# Patient Record
Sex: Male | Born: 1965 | Race: Black or African American | Hispanic: No | Smoking: Never smoker
Health system: Southern US, Community
[De-identification: ages and names within clinical notes are randomized; demographics above are authoritative.]

## PROBLEM LIST (undated history)

## (undated) DIAGNOSIS — M1990 Unspecified osteoarthritis, unspecified site: Secondary | ICD-10-CM

## (undated) DIAGNOSIS — I6783 Posterior reversible encephalopathy syndrome: Secondary | ICD-10-CM

## (undated) DIAGNOSIS — I639 Cerebral infarction, unspecified: Secondary | ICD-10-CM

## (undated) DIAGNOSIS — I10 Essential (primary) hypertension: Secondary | ICD-10-CM

## (undated) HISTORY — PX: COLONOSCOPY: SHX174

---

## 2005-06-08 ENCOUNTER — Emergency Department (HOSPITAL_COMMUNITY): Admission: EM | Admit: 2005-06-08 | Discharge: 2005-06-08 | Payer: Self-pay | Admitting: Emergency Medicine

## 2012-11-17 ENCOUNTER — Encounter (HOSPITAL_COMMUNITY): Payer: Self-pay | Admitting: Emergency Medicine

## 2012-11-17 ENCOUNTER — Inpatient Hospital Stay (HOSPITAL_COMMUNITY)
Admission: EM | Admit: 2012-11-17 | Discharge: 2012-11-24 | DRG: 064 | Disposition: A | Discharge status: Home / Self Care | Payer: MEDICAID | Attending: Internal Medicine | Admitting: Internal Medicine

## 2012-11-17 ENCOUNTER — Encounter (HOSPITAL_COMMUNITY): Payer: Self-pay | Admitting: *Deleted

## 2012-11-17 ENCOUNTER — Emergency Department (HOSPITAL_COMMUNITY): Payer: Self-pay

## 2012-11-17 ENCOUNTER — Emergency Department (INDEPENDENT_AMBULATORY_CARE_PROVIDER_SITE_OTHER)
Admission: EM | Admit: 2012-11-17 | Discharge: 2012-11-17 | Disposition: A | Discharge status: Nursing Home (Includes ICF) | Payer: Self-pay | Source: Home / Self Care | Attending: Emergency Medicine | Admitting: Emergency Medicine

## 2012-11-17 DIAGNOSIS — I809 Phlebitis and thrombophlebitis of unspecified site: Secondary | ICD-10-CM | POA: Diagnosis present

## 2012-11-17 DIAGNOSIS — I639 Cerebral infarction, unspecified: Secondary | ICD-10-CM

## 2012-11-17 DIAGNOSIS — G9349 Other encephalopathy: Secondary | ICD-10-CM | POA: Diagnosis not present

## 2012-11-17 DIAGNOSIS — Z7982 Long term (current) use of aspirin: Secondary | ICD-10-CM

## 2012-11-17 DIAGNOSIS — Z8249 Family history of ischemic heart disease and other diseases of the circulatory system: Secondary | ICD-10-CM

## 2012-11-17 DIAGNOSIS — R29810 Facial weakness: Secondary | ICD-10-CM | POA: Diagnosis present

## 2012-11-17 DIAGNOSIS — R519 Headache, unspecified: Secondary | ICD-10-CM

## 2012-11-17 DIAGNOSIS — R51 Headache: Secondary | ICD-10-CM

## 2012-11-17 DIAGNOSIS — I6350 Cerebral infarction due to unspecified occlusion or stenosis of unspecified cerebral artery: Principal | ICD-10-CM | POA: Diagnosis present

## 2012-11-17 DIAGNOSIS — R9431 Abnormal electrocardiogram [ECG] [EKG]: Secondary | ICD-10-CM | POA: Diagnosis present

## 2012-11-17 DIAGNOSIS — I10 Essential (primary) hypertension: Secondary | ICD-10-CM

## 2012-11-17 DIAGNOSIS — I519 Heart disease, unspecified: Secondary | ICD-10-CM | POA: Diagnosis present

## 2012-11-17 DIAGNOSIS — Z823 Family history of stroke: Secondary | ICD-10-CM

## 2012-11-17 DIAGNOSIS — R471 Dysarthria and anarthria: Secondary | ICD-10-CM | POA: Diagnosis present

## 2012-11-17 DIAGNOSIS — I169 Hypertensive crisis, unspecified: Secondary | ICD-10-CM

## 2012-11-17 DIAGNOSIS — I119 Hypertensive heart disease without heart failure: Secondary | ICD-10-CM

## 2012-11-17 DIAGNOSIS — E876 Hypokalemia: Secondary | ICD-10-CM | POA: Diagnosis present

## 2012-11-17 DIAGNOSIS — I160 Hypertensive urgency: Secondary | ICD-10-CM | POA: Diagnosis present

## 2012-11-17 HISTORY — DX: Essential (primary) hypertension: I10

## 2012-11-17 HISTORY — DX: Cerebral infarction, unspecified: I63.9

## 2012-11-17 LAB — URINALYSIS, ROUTINE W REFLEX MICROSCOPIC
Bilirubin Urine: NEGATIVE
Glucose, UA: NEGATIVE mg/dL
Specific Gravity, Urine: 1.009 (ref 1.005–1.030)
pH: 6.5 (ref 5.0–8.0)

## 2012-11-17 LAB — CBC WITH DIFFERENTIAL/PLATELET
Basophils Absolute: 0 10*3/uL (ref 0.0–0.1)
Basophils Relative: 0 % (ref 0–1)
Eosinophils Relative: 0 % (ref 0–5)
HCT: 40.7 % (ref 39.0–52.0)
Hemoglobin: 14.8 g/dL (ref 13.0–17.0)
Monocytes Absolute: 0.8 10*3/uL (ref 0.1–1.0)
Neutro Abs: 7 10*3/uL (ref 1.7–7.7)
Platelets: 183 10*3/uL (ref 150–400)
WBC: 9.7 10*3/uL (ref 4.0–10.5)

## 2012-11-17 LAB — URINE MICROSCOPIC-ADD ON

## 2012-11-17 LAB — BASIC METABOLIC PANEL: Glucose, Bld: 122 mg/dL — ABNORMAL HIGH (ref 70–99)

## 2012-11-17 LAB — POCT I-STAT TROPONIN I

## 2012-11-17 MED ORDER — CLONIDINE HCL 0.1 MG PO TABS
ORAL_TABLET | ORAL | Status: AC
  Filled 2012-11-17: qty 2

## 2012-11-17 MED ORDER — NICARDIPINE HCL IN NACL 20-0.86 MG/200ML-% IV SOLN
5.0000 mg/h | Freq: Once | INTRAVENOUS | Status: AC
  Administered 2012-11-17: 5 mg/h via INTRAVENOUS
  Filled 2012-11-17: qty 200

## 2012-11-17 MED ORDER — SODIUM CHLORIDE 0.9 % IV SOLN: INTRAVENOUS | Status: DC

## 2012-11-17 MED ORDER — HYDRALAZINE HCL 20 MG/ML IJ SOLN
5.0000 mg | Freq: Once | INTRAMUSCULAR | Status: AC
  Administered 2012-11-17: 5 mg via INTRAVENOUS
  Filled 2012-11-17: qty 1

## 2012-11-17 MED ORDER — HYDRALAZINE HCL 20 MG/ML IJ SOLN
10.0000 mg | INTRAMUSCULAR | Status: DC | PRN
  Administered 2012-11-18 – 2012-11-20 (×4): 10 mg via INTRAVENOUS
  Filled 2012-11-17 (×4): qty 1

## 2012-11-17 MED ORDER — DIPHENHYDRAMINE HCL 50 MG/ML IJ SOLN
25.0000 mg | Freq: Once | INTRAMUSCULAR | Status: AC
  Administered 2012-11-17: 25 mg via INTRAVENOUS
  Filled 2012-11-17: qty 1

## 2012-11-17 MED ORDER — HYDRALAZINE HCL 20 MG/ML IJ SOLN
20.0000 mg | Freq: Once | INTRAMUSCULAR | Status: AC
  Administered 2012-11-17: 20 mg via INTRAVENOUS
  Filled 2012-11-17: qty 1

## 2012-11-17 MED ORDER — ACETAMINOPHEN 325 MG PO TABS
650.0000 mg | ORAL_TABLET | Freq: Four times a day (QID) | ORAL | Status: DC | PRN
  Administered 2012-11-19 (×2): 650 mg via ORAL
  Filled 2012-11-17 (×2): qty 2

## 2012-11-17 MED ORDER — ACETAMINOPHEN 650 MG RE SUPP: 650.0000 mg | Freq: Four times a day (QID) | RECTAL | Status: DC | PRN

## 2012-11-17 MED ORDER — CLONIDINE HCL 0.1 MG PO TABS
0.2000 mg | ORAL_TABLET | Freq: Once | ORAL | Status: AC
  Administered 2012-11-17: 0.2 mg via ORAL

## 2012-11-17 MED ORDER — SODIUM CHLORIDE 0.9 % IJ SOLN
3.0000 mL | Freq: Two times a day (BID) | INTRAMUSCULAR | Status: DC
  Administered 2012-11-18 – 2012-11-24 (×8): 3 mL via INTRAVENOUS

## 2012-11-17 MED ORDER — ACETAMINOPHEN 325 MG PO TABS
ORAL_TABLET | ORAL | Status: AC
  Filled 2012-11-17: qty 2

## 2012-11-17 MED ORDER — METOCLOPRAMIDE HCL 5 MG/ML IJ SOLN
10.0000 mg | Freq: Once | INTRAMUSCULAR | Status: AC
  Administered 2012-11-17: 10 mg via INTRAVENOUS
  Filled 2012-11-17: qty 2

## 2012-11-17 MED ORDER — ASPIRIN EC 81 MG PO TBEC
81.0000 mg | DELAYED_RELEASE_TABLET | Freq: Every day | ORAL | Status: DC
  Administered 2012-11-18 – 2012-11-24 (×7): 81 mg via ORAL
  Filled 2012-11-17 (×7): qty 1

## 2012-11-17 MED ORDER — ACETAMINOPHEN 325 MG PO TABS
650.0000 mg | ORAL_TABLET | Freq: Once | ORAL | Status: AC
  Administered 2012-11-17: 650 mg via ORAL

## 2012-11-17 NOTE — ED Notes (Signed)
Headache onset Wednesday, pain behind right eye and in temporal area, bilaterally.  Denies chest pain, no sob.

## 2012-11-17 NOTE — ED Notes (Signed)
The pt was sent from [facility] with a headache since Wednesday with nv.  He  Was given tylenol and bp pills at [facility].  No history of headaches

## 2012-11-17 NOTE — H&P (Addendum)
Chief Complaint:  headache  HPI: 47 yo male healthy comes in with headache and checked bp over the last several days noted to be high.  He denies any fevers, n/v/d.  No slurred speech or focal neurological deficits.  No cp, no sob.  No le edema or swelling.  Exercises regularly and overall healthy, but htn and strokes run in his family.  He initially had sbp 230 on arrival with high diastolic also, given hydralazine in ED with improvement in his bp and also improvement in his headache.  No rashes.  Pt feels better now, and his sbp is now around 180 dbp 98.  Offered LP in ED by EDP but pt deferred that.  He has no prior cardiac or cva history.  Review of Systems:  Positive and negative as per HPI otherwise all other systems are negative  Past Medical History: Past Medical History  Diagnosis Date  . Hypertension    History reviewed. No pertinent past surgical history.  Medications: Prior to Admission medications   Medication Sig Start Date End Date Taking? Authorizing Provider  ibuprofen (ADVIL,MOTRIN) 200 MG tablet Take 200 mg by mouth once.    Yes Historical Provider, MD  naproxen sodium (ANAPROX) 220 MG tablet Take 440 mg by mouth once.   Yes Historical Provider, MD    Allergies:  No Known Allergies  Social History:  reports that he has never smoked. He does not have any smokeless tobacco history on file. He reports that he does not drink alcohol or use illicit drugs.  Family History: Htn, cva  Physical Exam: Filed Vitals:   11/17/12 2100 11/17/12 2110 11/17/12 2120 11/17/12 2130  BP: 192/93 175/102 179/103 181/97  Pulse: 66 71 77 85  Temp:      Resp: 25 23 33 26  SpO2: 98% 97% 97% 98%   General appearance: alert, cooperative and no distress Head: Normocephalic, without obvious abnormality, atraumatic Eyes: negative Nose: Nares normal. Septum midline. Mucosa normal. No drainage or sinus tenderness. Neck: no JVD and supple, symmetrical, trachea midline Lungs: clear to  auscultation bilaterally Heart: regular rate and rhythm, S1, S2 normal, no murmur, click, rub or gallop Abdomen: soft, non-tender; bowel sounds normal; no masses,  no organomegaly Extremities: extremities normal, atraumatic, no cyanosis or edema Pulses: 2+ and symmetric Skin: Skin color, texture, turgor normal. No rashes or lesions Neurologic: Grossly normal    Labs on Admission:   Recent Labs  11/17/12 1820  NA 133*  K 3.7  CL 95*  CO2 27  GLUCOSE 122*  BUN 10  CREATININE 0.99  CALCIUM 9.1    Recent Labs  11/17/12 1820  WBC 9.7  NEUTROABS 7.0  HGB 14.8  HCT 40.7  MCV 83.4  PLT 183   Radiological Exams on Admission: Ct Head Wo Contrast  11/17/2012   *RADIOLOGY REPORT*  Clinical Data: Severe headaches.  CT HEAD WITHOUT CONTRAST  Technique:  Contiguous axial images were obtained from the base of the skull through the vertex without contrast.  Comparison: None.  Findings: No evidence for acute hemorrhage, mass lesion, midline shift, hydrocephalus or large infarct.  Subtle low density in the left frontal white matter may represent chronic changes.  In general, the white matter is very prominent and this may be related to technical factors.  Normal appearance of the ventricles and ventricles.  Visualized sinuses are clear.  No acute bony abnormality.  IMPRESSION: No acute intracranial abnormality.  Subtle low density in the left frontal white matter.  Findings  could represent chronic changes.   Original Report Authenticated By: Richarda Overlie, M.D.    Assessment/Plan 47 yo male with hypertensive urgency and headache  Principal Problem:   Hypertensive urgency Active Problems:   Headache   Hypertension  Place on tele, iv hydralazine prn.  If need place on cardene gtt.  Neuro cks frequently.  Asa.  Start on oral antihypertensives.  Full code.  DAVID,RACHAL A 11/17/2012, 9:35 PM   After pt admitted, while waiting on bed in ED, the ED nurse noted brief episode of slurred speech  and facial drooping that quickly resolved.  Have called neuro on call tonight for consultation, recommends to proceed with asa tx, cth initially shows no bleed.  Await further w/u recommendations per neuro team,

## 2012-11-17 NOTE — ED Notes (Signed)
Chart review for transfer.

## 2012-11-17 NOTE — ED Provider Notes (Signed)
Chief Complaint:   Chief Complaint  Patient presents with  . Headache    History of Present Illness:   Cameron Eaton is a 47 year old male with no prior history of high blood pressure, migraine headaches, or any kind of headaches, who 3 days ago developed sudden onset of a severe, bitemporal headache after eating a meal. This was rated 6-7/10 in intensity it's been associated with nausea and he vomited once. He has photophobia but no phonophobia. The pain is not worse with exercise but does get better with rest or with a cold rag. He says this is the worst headache of his life. He denies any visual symptoms, fever, chills, stiff neck, nasal congestion, rhinorrhea, or sore throat. He has had no difficulty with speech, swallowing, dizziness, coordination, balance, equilibrium, and denies numbness, tingling, paresthesias, or localized muscle weakness. He has no trouble with ambulation. He has not had any prior history of high blood pressure and is not taking any medication for his blood pressure. He denies any history of heart disease, diabetes, high cholesterol, cigarette smoking, or kidney disease.  Review of Systems:  Other than noted above, the patient denies any of the following symptoms: Systemic:  No fever, chills, fatigue, photophobia, stiff neck. Eye:  No redness, eye pain, discharge, blurred vision, or diplopia. ENT:  No nasal congestion, rhinorrhea, sinus pressure or pain, sneezing, earache, or sore throat.  No jaw claudication. Neuro:  No paresthesias, loss of consciousness, seizure activity, muscle weakness, trouble with coordination or gait, trouble speaking or swallowing. Psych:  No depression, anxiety or trouble sleeping.  PMFSH:  Past medical history, family history, social history, meds, and allergies were reviewed.   Physical Exam:   Vital signs:  BP 200/105  Pulse 69  Temp(Src) 98 F (36.7 C) (Oral)  Resp 18  SpO2 97% General:  Alert and oriented.  He is lying on his back  in a darkened room with a wet read on his forehead and appears uncomfortable due to pain. Eye:  Lids and conjunctivas normal.  PERRL,  Full EOMs.  Pupils were small and fundi were not well seen. ENT:  No cranial or facial tenderness to palpation.  TMs and canals clear.  Nasal mucosa was normal and uncongested without any drainage. No intra oral lesions, pharynx clear, mucous membranes moist, dentition normal. Neck:  Supple, full ROM, no tenderness to palpation.  No adenopathy or mass. Neuro:  Alert and orented times 3.  Speech was clear, fluent, and appropriate.  Cranial nerves intact. No pronator drift, muscle strength normal. Finger to nose normal.  DTRs were 2+ and symmetrical.Station and gait were normal.  Romberg's sign was normal.  Able to perform tandem gait well. Psych:  Normal affect.  Course in Urgent Care Center:   He was given clonidine 0.2 mg by mouth and acetaminophen 650 mg by mouth.   Assessment:  The primary encounter diagnosis was Headache. A diagnosis of Hypertension was also pertinent to this visit.  The headaches that you be due to high blood pressure, although I suspect he has had high blood pressure for a long time. It's also possible this could be a migraine headache, although he is never had a migraine headache or any kind of headache in the past. Since this is a new type of headache for him and he rates it as the worst headache of his life, I think he needs a CT scan to rule out subarachnoid hemorrhage. We will transfer him via shuttle to the emergency department.  Plan:   1.  The following meds were prescribed:   New Prescriptions   No medications on file   2.  The patient was transferred by shuttle to the emergency department after receiving his medications by mouth. He is in stable condition.  Medical Decision Making:  47 year old male with no prior history of HT or migraine headache had sudden onset of severe, bitemporal headache 3 days ago.  Associated with nausea,  vomiting and photophobia.  No neurological symptoms.  Patient states this is the worse headache in his life.  He does not usually get headaches and has no history of migraines.  His exam is normal except for a BP of 200/105.  History is concerning for subarachnoid hemorrhage since this a new headache and he has not had similar pain in the past.  Also rated as worst headache ever.  I think he needs a cranial CT.  We have given clonidine 0.2 for the BP and Tylenol 650 mg for the headache.        Reuben Likes, MD 11/17/12 [phone]

## 2012-11-17 NOTE — Consult Note (Signed)
NEURO HOSPITALIST CONSULT NOTE    Reason for Consult:Headache, transient left facial droop and dysarthria.   HPI:                                                                                                                                          Cameron Eaton is an 47 y.o. male with appaently no significant past medical history, admitted to [facility] withy new onset headache and resolved left face droopiness and slurred speech. He denies ever having headache, but indicated that for the past 2-3 days he has been experiencing very severe, daily, off and on dull headache located over the right temple. Stated that the headache started " very suddenly" without a precipitating factor that he can tell. It is hard to function at the peak of the headache but the pain is not exacerbated by movement and stressed that it gets better when he is standing up. There is mild sensitivity to light but no phonophobia, nausea, vomiting, vertigo, double vision, difficulty swallowing, focal weakness or numbness, language or vision disturbances. No recent fever, head or neck trauma, or infection. He initially had sbp 230 on arrival with high diastolic also, given hydralazine in ED with improvement in his bp and also improvement in his headache CT brain unremarkable. He refused LP in the ED and said that he is not willing to pursue that procedure at this moment.    Past Medical History  Diagnosis Date  . Hypertension     History reviewed. No pertinent past surgical history.  No family history on file.  Family History: No brain aneurysm or migraine in the family.  Social History:  reports that he has never smoked. He does not have any smokeless tobacco history on file. He reports that he does not drink alcohol or use illicit drugs.  No Known Allergies  MEDICATIONS:                                                                                                                     I have  reviewed the patient's current medications.   ROS:  History obtained from the patient and chart review  General ROS: negative for - chills, fatigue, fever, night sweats, weight gain or weight loss Psychological ROS: negative for - behavioral disorder, hallucinations, memory difficulties, mood swings or suicidal ideation Ophthalmic ROS: negative for - blurry vision, double vision, eye pain or loss of vision ENT ROS: negative for - epistaxis, nasal discharge, oral lesions, sore throat, tinnitus or vertigo Allergy and Immunology ROS: negative for - hives or itchy/watery eyes Hematological and Lymphatic ROS: negative for - bleeding problems, bruising or swollen lymph nodes Endocrine ROS: negative for - galactorrhea, hair pattern changes, polydipsia/polyuria or temperature intolerance Respiratory ROS: negative for - cough, hemoptysis, shortness of breath or wheezing Cardiovascular ROS: negative for - chest pain, dyspnea on exertion, edema or irregular heartbeat Gastrointestinal ROS: negative for - abdominal pain, diarrhea, hematemesis, nausea/vomiting or stool incontinence Genito-Urinary ROS: negative for - dysuria, hematuria, incontinence or urinary frequency/urgency Musculoskeletal ROS: negative for - joint swelling or muscular weakness Neurological ROS: as noted in HPI Dermatological ROS: negative for rash and skin lesion changes      Physical exam: pleasant male in distress due to severe headache. Blood pressure 170/73, pulse 74, temperature 100.1 F (37.8 C), temperature source Oral, resp. rate 20, height 5\' 11"  (1.803 m), weight 93.1 kg (205 lb 4 oz), SpO2 95.00%. Head: normocephalic. Neck: supple, no bruits, no JVD. Cardiac: no murmurs. Lungs: clear. Abdomen: soft, no tender, no mass. Extremities: no edema.  Neurologic Examination:                                                                                                       Mental Status: Alert, oriented, thought content appropriate.  Speech fluent without evidence of aphasia.  Able to follow 3 step commands without difficulty. Cranial Nerves: II: Discs flat bilaterally; Visual fields grossly normal, pupils equal, round, reactive to light III,IV, VI: ptosis not present, extra-ocular motions intact bilaterally V,VII: smile symmetric, facial light touch sensation normal bilaterally VIII: hearing normal bilaterally IX,X: gag reflex present XI: bilateral shoulder shrug XII: midline tongue extension Motor: Moves all limbs spontaneously and symmetrically    Tone and bulk:normal tone throughout; no atrophy noted Sensory: Pinprick and light touch intact throughout, bilaterally Deep Tendon Reflexes:  2+ all over Plantars: Right: downgoing   Left: downgoing Cerebellar: normal finger-to-nose,  normal heel-to-shin test Gait:  no tested. Neck: no meningeal irritation signs CV: pulses palpable throughout    No results found for this basename: cbc, bmp, coags, chol, tri, ldl, hga1c    Results for orders placed during the hospital encounter of 11/17/12 (from the past 48 hour(s))  CBC WITH DIFFERENTIAL     Status: Abnormal   Collection Time    11/17/12  6:20 PM      Result Value Range   WBC 9.7  4.0 - 10.5 K/uL   RBC 4.88  4.22 - 5.81 MIL/uL   Hemoglobin 14.8  13.0 - 17.0 g/dL   HCT 16.1  09.6 - 04.5 %   MCV 83.4  78.0 - 100.0 fL   MCH 30.3  26.0 - 34.0 pg   MCHC 36.4 (*) 30.0 - 36.0 g/dL   RDW 16.1  09.6 - 04.5 %   Platelets 183  150 - 400 K/uL   Neutrophils Relative % 72  43 - 77 %   Neutro Abs 7.0  1.7 - 7.7 K/uL   Lymphocytes Relative 20  12 - 46 %   Lymphs Abs 1.9  0.7 - 4.0 K/uL   Monocytes Relative 8  3 - 12 %   Monocytes Absolute 0.8  0.1 - 1.0 K/uL   Eosinophils Relative 0  0 - 5 %   Eosinophils Absolute 0.0  0.0 - 0.7 K/uL   Basophils Relative 0  0 - 1 %    Basophils Absolute 0.0  0.0 - 0.1 K/uL  BASIC METABOLIC PANEL     Status: Abnormal   Collection Time    11/17/12  6:20 PM      Result Value Range   Sodium 133 (*) 135 - 145 mEq/L   Potassium 3.7  3.5 - 5.1 mEq/L   Chloride 95 (*) 96 - 112 mEq/L   CO2 27  19 - 32 mEq/L   Glucose, Bld 122 (*) 70 - 99 mg/dL   BUN 10  6 - 23 mg/dL   Creatinine, Ser 4.09  0.50 - 1.35 mg/dL   Calcium 9.1  8.4 - 81.1 mg/dL   GFR calc non Af Amer >90  >90 mL/min   GFR calc Af Amer >90  >90 mL/min   Comment:            The eGFR has been calculated     using the CKD EPI equation.     This calculation has not been     validated in all clinical     situations.     eGFR's persistently     <90 mL/min signify     possible Chronic Kidney Disease.  POCT I-STAT TROPONIN I     Status: None   Collection Time    11/17/12  7:19 PM      Result Value Range   Troponin i, poc 0.00  0.00 - 0.08 ng/mL   Comment 3            Comment: Due to the release kinetics of cTnI,     a negative result within the first hours     of the onset of symptoms does not rule out     myocardial infarction with certainty.     If myocardial infarction is still suspected,     repeat the test at appropriate intervals.  URINALYSIS, ROUTINE W REFLEX MICROSCOPIC     Status: Abnormal   Collection Time    11/17/12  7:26 PM      Result Value Range   Color, Urine YELLOW  YELLOW   APPearance CLEAR  CLEAR   Specific Gravity, Urine 1.009  1.005 - 1.030   pH 6.5  5.0 - 8.0   Glucose, UA NEGATIVE  NEGATIVE mg/dL   Hgb urine dipstick TRACE (*) NEGATIVE   Bilirubin Urine NEGATIVE  NEGATIVE   Ketones, ur 15 (*) NEGATIVE mg/dL   Protein, ur NEGATIVE  NEGATIVE mg/dL   Urobilinogen, UA 0.2  0.0 - 1.0 mg/dL   Nitrite NEGATIVE  NEGATIVE   Leukocytes, UA NEGATIVE  NEGATIVE  URINE MICROSCOPIC-ADD ON     Status: Abnormal   Collection Time    11/17/12  7:26 PM      Result Value Range   Squamous  Epithelial / LPF RARE  RARE   WBC, UA 0-2  <3 WBC/hpf    RBC / HPF 0-2  <3 RBC/hpf   Bacteria, UA RARE  RARE   Casts HYALINE CASTS (*) NEGATIVE    Ct Head Wo Contrast  11/17/2012   *RADIOLOGY REPORT*  Clinical Data: Severe headaches.  CT HEAD WITHOUT CONTRAST  Technique:  Contiguous axial images were obtained from the base of the skull through the vertex without contrast.  Comparison: None.  Findings: No evidence for acute hemorrhage, mass lesion, midline shift, hydrocephalus or large infarct.  Subtle low density in the left frontal white matter may represent chronic changes.  In general, the white matter is very prominent and this may be related to technical factors.  Normal appearance of the ventricles and ventricles.  Visualized sinuses are clear.  No acute bony abnormality.  IMPRESSION: No acute intracranial abnormality.  Subtle low density in the left frontal white matter.  Findings could represent chronic changes.   Original Report Authenticated By: Richarda Overlie, M.D.     Assessment/Plan: 47 years old with new onset severe right frontal headache x 3 days, persistently elevated BP, and reported transient droopiness left face and slurred speech. Normal neuro-exam at this moment. CT brain unremarkable. Differential diagnosis include new onset migraine, headache in the context of hypertensive urgency, or thunderclap HA in the setting of SAH. Clinical presentation not consistent with CNS infection. Declined LP  In the ED. I had an ample conversation with Cameron Eaton regarding the importance of performing LP tonight to ensure that he did not have a SAH no visible on CT brain and he adamantly refused again to pursue that pathway. Will get MRI/MRA brain. Depacon and Toradol for symptomatic HA control.  Wyatt Portela, MD [facility] [phone]  11/17/2012, 11:55 PM

## 2012-11-17 NOTE — ED Provider Notes (Signed)
History    CSN: [id number] Arrival date & time 11/17/12  7829  First MD Initiated Contact with Patient 11/17/12 1814     Chief Complaint  Patient presents with  . Headache   (Consider location/radiation/quality/duration/timing/severity/associated sxs/prior Treatment) Patient is a 47 y.o. male presenting with headaches. The history is provided by the patient and a relative.  Headache  patient here complaining of bitemporal headache x48 hours. No prior history of same. Pain has been persistent and nothing makes it better or worse. That started after eating a meal. Denies any neck pain or photophobia. No fever or chills. No confusion and lethargy. No unilateral weakness noted. Denies any dizziness or ataxia. Went to urgent care Center with similar symptoms and sent here for evaluation today. Was given clonidine and Tylenol for his headache as he was noted to be severely hypertensive with a systolic over 100 diastolic over. He denies any chest pain shortness of breath. Denies any abdominal pain. Patient denies any visual changes. Past Medical History  Diagnosis Date  . Hypertension    History reviewed. No pertinent past surgical history. No family history on file. History  Substance Use Topics  . Smoking status: Never Smoker   . Smokeless tobacco: Not on file  . Alcohol Use: No    Review of Systems  Neurological: Positive for headaches.  All other systems reviewed and are negative.    Allergies  Review of patient's allergies indicates no known allergies.  Home Medications   Current Outpatient Rx  Name  Route  Sig  Dispense  Refill  . ibuprofen (ADVIL,MOTRIN) 200 MG tablet   Oral   Take 200 mg by mouth every 6 (six) hours as needed for pain.         . Naproxen Sodium (ALEVE PO)   Oral   Take by mouth.          BP 221/131  Pulse 70  Temp(Src) 98.8 F (37.1 C)  Resp 18  SpO2 97% Physical Exam  Nursing note and vitals reviewed. Constitutional: He is oriented to  person, place, and time. He appears well-developed and well-nourished.  Non-toxic appearance. No distress.  HENT:  Head: Normocephalic and atraumatic.  Eyes: Conjunctivae, EOM and lids are normal. Pupils are equal, round, and reactive to light.  Neck: Normal range of motion. Neck supple. No tracheal deviation present. No mass present.  Cardiovascular: Normal rate, regular rhythm and normal heart sounds.  Exam reveals no gallop.   No murmur heard. Pulmonary/Chest: Effort normal and breath sounds normal. No stridor. No respiratory distress. He has no decreased breath sounds. He has no wheezes. He has no rhonchi. He has no rales.  Abdominal: Soft. Normal appearance and bowel sounds are normal. He exhibits no distension. There is no tenderness. There is no rebound and no CVA tenderness.  Musculoskeletal: Normal range of motion. He exhibits no edema and no tenderness.  Neurological: He is alert and oriented to person, place, and time. He has normal strength. No cranial nerve deficit or sensory deficit. GCS eye subscore is 4. GCS verbal subscore is 5. GCS motor subscore is 6.  Skin: Skin is warm and dry. No abrasion and no rash noted.  Psychiatric: He has a normal mood and affect. His speech is normal and behavior is normal.    ED Course  Procedures (including critical care time) Labs Reviewed  CBC WITH DIFFERENTIAL  BASIC METABOLIC PANEL  URINALYSIS, ROUTINE W REFLEX MICROSCOPIC   No results found. No diagnosis found.  MDM   Date: 11/17/2012  Rate: 66  Rhythm: normal sinus rhythm  QRS Axis: normal  Intervals: normal  ST/T Wave abnormalities: nonspecific ST changes  Conduction Disutrbances:none  Narrative Interpretation: LVH   Old EKG Reviewed: none available  8:29 PM Patient given hydralazine along with Reglan and Benadryl. Patient's headache is greatly improved as his blood pressure has decreased. Because of the severity of his headache I have offered him a lumbar puncture to rule  out subarachnoid hemorrhage which he has deferred at this time. the risk of death from aneurysm has been explained to him and he accepts this. We'll continue to monitor and decreases blood pressure  9:29 PM Patient was to be started on a Cardene drip but after speaking with the hospitalist this is been deferred. His neurological exam isn't repeated and remained stable. He has no gross focal deficits. He will be admitted to telemetry  CRITICAL CARE Performed by: Toy Baker Total critical care time: 29 Critical care time was exclusive of separately billable procedures and treating other patients. Critical care was necessary to treat or prevent imminent or life-threatening deterioration. Critical care was time spent personally by me on the following activities: development of treatment plan with patient and/or surrogate as well as nursing, discussions with consultants, evaluation of patient's response to treatment, examination of patient, obtaining history from patient or surrogate, ordering and performing treatments and interventions, ordering and review of laboratory studies, ordering and review of radiographic studies, pulse oximetry and re-evaluation of patient's condition.   Toy Baker, MD 11/17/12 2129

## 2012-11-17 NOTE — ED Notes (Signed)
Pt presents with slight L facial droop and slight slurred speech. Sx resolved within 2 minutes. Onalee Hua MD notified.

## 2012-11-17 NOTE — ED Notes (Signed)
Notified dr Lorenz Coaster of patient and verified notification of blood pressure readings

## 2012-11-18 ENCOUNTER — Inpatient Hospital Stay (HOSPITAL_COMMUNITY): Payer: Self-pay

## 2012-11-18 DIAGNOSIS — I639 Cerebral infarction, unspecified: Secondary | ICD-10-CM

## 2012-11-18 DIAGNOSIS — R471 Dysarthria and anarthria: Secondary | ICD-10-CM

## 2012-11-18 HISTORY — DX: Cerebral infarction, unspecified: I63.9

## 2012-11-18 LAB — MRSA PCR SCREENING: MRSA by PCR: NEGATIVE

## 2012-11-18 LAB — BASIC METABOLIC PANEL
Chloride: 99 mEq/L (ref 96–112)
GFR calc Af Amer: >90 mL/min (ref >90)
Potassium: 3 mEq/L — ABNORMAL LOW (ref 3.5–5.1)
Sodium: 136 mEq/L (ref 135–145)

## 2012-11-18 LAB — CBC
HCT: 41.9 % (ref 39.0–52.0)
Hemoglobin: 15 g/dL (ref 13.0–17.0)
RBC: 5.03 MIL/uL (ref 4.22–5.81)
RDW: 13 % (ref 11.5–15.5)
WBC: 12.8 10*3/uL — ABNORMAL HIGH (ref 4.0–10.5)

## 2012-11-18 LAB — TROPONIN I
Troponin I: <0.3 ng/mL (ref <0.30)
Troponin I: <0.3 ng/mL (ref <0.30)

## 2012-11-18 MED ORDER — ATENOLOL 25 MG PO TABS
25.0000 mg | ORAL_TABLET | Freq: Every day | ORAL | Status: DC
  Administered 2012-11-18: 25 mg via ORAL
  Filled 2012-11-18 (×2): qty 1

## 2012-11-18 MED ORDER — NICARDIPINE HCL IN NACL 20-0.86 MG/200ML-% IV SOLN
5.0000 mg/h | INTRAVENOUS | Status: DC
  Administered 2012-11-18 (×6): 5 mg/h via INTRAVENOUS
  Filled 2012-11-18 (×6): qty 200

## 2012-11-18 MED ORDER — LISINOPRIL 40 MG PO TABS
40.0000 mg | ORAL_TABLET | Freq: Every day | ORAL | Status: DC
  Administered 2012-11-18 – 2012-11-21 (×4): 40 mg via ORAL
  Filled 2012-11-18 (×5): qty 1

## 2012-11-18 MED ORDER — OXYCODONE-ACETAMINOPHEN 5-325 MG PO TABS
2.0000 | ORAL_TABLET | Freq: Four times a day (QID) | ORAL | Status: DC | PRN
  Administered 2012-11-18 – 2012-11-19 (×3): 2 via ORAL
  Filled 2012-11-18 (×3): qty 2

## 2012-11-18 MED ORDER — KETOROLAC TROMETHAMINE 15 MG/ML IJ SOLN
15.0000 mg | Freq: Three times a day (TID) | INTRAMUSCULAR | Status: AC
  Administered 2012-11-18 (×3): 15 mg via INTRAVENOUS
  Filled 2012-11-18 (×3): qty 1

## 2012-11-18 MED ORDER — SODIUM CHLORIDE 0.9 % IV SOLN
INTRAVENOUS | Status: DC
  Administered 2012-11-18 – 2012-11-20 (×2): 10 mL/h via INTRAVENOUS

## 2012-11-18 MED ORDER — HYDROCHLOROTHIAZIDE 25 MG PO TABS
25.0000 mg | ORAL_TABLET | Freq: Every day | ORAL | Status: DC
  Administered 2012-11-18 – 2012-11-24 (×7): 25 mg via ORAL
  Filled 2012-11-18 (×7): qty 1

## 2012-11-18 MED ORDER — GADOBENATE DIMEGLUMINE 529 MG/ML IV SOLN
17.0000 mL | Freq: Once | INTRAVENOUS | Status: AC | PRN
  Administered 2012-11-18: 17 mL via INTRAVENOUS

## 2012-11-18 MED ORDER — POTASSIUM CHLORIDE CRYS ER 20 MEQ PO TBCR
40.0000 meq | EXTENDED_RELEASE_TABLET | Freq: Once | ORAL | Status: AC
  Administered 2012-11-18: 40 meq via ORAL
  Filled 2012-11-18: qty 2

## 2012-11-18 MED ORDER — VALPROATE SODIUM 500 MG/5ML IV SOLN
500.0000 mg | Freq: Three times a day (TID) | INTRAVENOUS | Status: DC
  Administered 2012-11-18 – 2012-11-20 (×8): 500 mg via INTRAVENOUS
  Filled 2012-11-18 (×12): qty 5

## 2012-11-18 NOTE — Progress Notes (Signed)
  Echocardiogram 2D Echocardiogram has been performed.  Cameron Eaton 11/18/2012, 9:17 AM

## 2012-11-18 NOTE — Progress Notes (Signed)
Subjective: Patient no longer complains of headache but reports that he is tired and trying to get some rest.  Tolerating Valproate.  BP improved/    Objective: Current vital signs: BP 137/56  Pulse 63  Temp(Src) 99.8 F (37.7 C) (Oral)  Resp 27  Ht 5\' 11"  (1.803 m)  Wt 93.1 kg (205 lb 4 oz)  BMI 28.64 kg/m2  SpO2 89% Vital signs in last 24 hours: Temp:  [98 F (36.7 C)-100.1 F (37.8 C)] 99.8 F (37.7 C) (07/05 0749) Pulse Rate:  [60-88] 63 (07/05 0900) Resp:  [10-33] 27 (07/05 0900) BP: (137-228)/(56-144) 137/56 mmHg (07/05 0900) SpO2:  [89 %-100 %] 89 % (07/05 0900) Weight:  [93.1 kg (205 lb 4 oz)] 93.1 kg (205 lb 4 oz) (07/04 2345)  Intake/Output from previous day: 07/04 0701 - 07/05 0700 In: 480 [I.V.:370; IV Piggyback:110] Out: 1100 [Urine:1100] Intake/Output this shift: Total I/O In: 180 [I.V.:180] Out: 525 [Urine:525] Nutritional status: NPO  Neurologic Exam: Mental Status:  Alert, oriented, thought content appropriate. Speech fluent without evidence of aphasia. Able to follow 3 step commands without difficulty.  Cranial Nerves:  II: Discs flat bilaterally; Visual fields grossly normal, pupils equal, round, reactive to light  III,IV, VI: ptosis not present, extra-ocular motions intact bilaterally  V,VII: smile symmetric, facial light touch sensation normal bilaterally  VIII: hearing normal bilaterally  IX,X: gag reflex present  XI: bilateral shoulder shrug  XII: midline tongue extension  Motor:  Moves all limbs spontaneously and symmetrically  Tone and bulk:normal tone throughout; no atrophy noted  Sensory: Pinprick and light touch intact throughout, bilaterally  Deep Tendon Reflexes:  2+ throughout  Plantars:  Right: downgoing     Left: downgoing   Lab Results: Basic Metabolic Panel:  Recent Labs Lab 11/17/12 1820 11/18/12 0605  NA 133* 136  K 3.7 3.0*  CL 95* 99  CO2 27 22  GLUCOSE 122* 139*  BUN 10 11  CREATININE 0.99 0.91  CALCIUM 9.1  8.9    Liver Function Tests: No results found for this basename: AST, ALT, ALKPHOS, BILITOT, PROT, ALBUMIN,  in the last 168 hours No results found for this basename: LIPASE, AMYLASE,  in the last 168 hours No results found for this basename: AMMONIA,  in the last 168 hours  CBC:  Recent Labs Lab 11/17/12 1820 11/18/12 0605  WBC 9.7 12.8*  NEUTROABS 7.0  --   HGB 14.8 15.0  HCT 40.7 41.9  MCV 83.4 83.3  PLT 183 184    Cardiac Enzymes:  Recent Labs Lab 11/18/12 0008 11/18/12 0605  TROPONINI <0.30 <0.30    Lipid Panel: No results found for this basename: CHOL, TRIG, HDL, CHOLHDL, VLDL, LDLCALC,  in the last 168 hours  CBG: No results found for this basename: GLUCAP,  in the last 168 hours  Microbiology: Results for orders placed during the hospital encounter of 11/17/12  MRSA PCR SCREENING     Status: None   Collection Time    11/17/12 11:47 PM      Result Value Range Status   MRSA by PCR NEGATIVE  NEGATIVE Final   Comment:            The GeneXpert MRSA Assay (FDA     approved for NASAL specimens     only), is one component of a     comprehensive MRSA colonization     surveillance program. It is not     intended to diagnose MRSA     infection nor  to guide or     monitor treatment for     MRSA infections.    Coagulation Studies: No results found for this basename: LABPROT, INR,  in the last 72 hours  Imaging: Ct Head Wo Contrast  11/17/2012   *RADIOLOGY REPORT*  Clinical Data: Severe headaches.  CT HEAD WITHOUT CONTRAST  Technique:  Contiguous axial images were obtained from the base of the skull through the vertex without contrast.  Comparison: None.  Findings: No evidence for acute hemorrhage, mass lesion, midline shift, hydrocephalus or large infarct.  Subtle low density in the left frontal white matter may represent chronic changes.  In general, the white matter is very prominent and this may be related to technical factors.  Normal appearance of the  ventricles and ventricles.  Visualized sinuses are clear.  No acute bony abnormality.  IMPRESSION: No acute intracranial abnormality.  Subtle low density in the left frontal white matter.  Findings could represent chronic changes.   Original Report Authenticated By: Richarda Overlie, M.D.    Medications:  I have reviewed the patient's current medications. Scheduled: . aspirin EC  81 mg Oral Daily  . ketorolac  15 mg Intravenous Q8H  . sodium chloride  3 mL Intravenous Q12H  . valproate sodium  500 mg Intravenous Q8H    Assessment/Plan: Headache improved.  Exam nonfocal.  BP controlled.  MRI/MRA of the brain is pending.    Recommendations: 1.  Will follow up results of imaging.   2.  Continued BP control.     LOS: 1 day   Thana Farr, MD [facility] [phone] 11/18/2012  10:53 AM

## 2012-11-18 NOTE — Progress Notes (Signed)
[facility] Progress Note [place] TEAM 1 - Stepdown/ICU TEAM   Cameron Eaton YQM:[id number] DOB: 12/27/1965 DOA: 11/17/2012 PCP: No PCP Per Patient  Brief narrative: 47 yo male healthy comes in with headache and checked bp over the last several days noted to be high. He denies any fevers, n/v/d. No slurred speech or focal neurological deficits. No cp, no sob. No le edema or swelling. Exercises regularly and overall healthy, but htn and strokes run in his family. He initially had sbp 230 on arrival with high diastolic also, given hydralazine in ED with improvement in his bp and also improvement in his headache. No rashes. Pt feels better now, and his sbp is now around 180 dbp 98. Offered LP in ED by EDP but pt deferred that. He has no prior cardiac or cva history.   Assessment/Plan: Principal Problem:   Hypertensive urgency/chronic hypertension Patient does not take blood pressure medication and does not follow with PCP -Currently on nicardipine infusion-Will start lisinopril/HCTZ and atenolol and wean off nicardipine -Has been strongly advised to continue medications-we will make him a followup appointment with the  free clinic -Echo reveals grade 1 diastolic dysfunction with a severely dilated left atrium  Transient left facial droop and dysarthria -Neurology is following an MRI is pending -CT head reveals low-density frontal white matter change  Hypokalemia -Replace and recheck in the morning    Code Status: Full code Family Communication: With Sister Disposition Plan: Follow in step down unit  Consultants: Neurology  Procedures: None  Antibiotics: None  DVT prophylaxis: Ambulatory  HPI/Subjective: Patient awake and alert-he currently has no complaints-he admits that he does not have a PCP but he does check his blood pressure he states it usually runs "a little high" with the top number being about 185.  Objective: Blood pressure 138/68, pulse 55,  temperature 98.3 F (36.8 C), temperature source Oral, resp. rate 25, height 5\' 11"  (1.803 m), weight 93.1 kg (205 lb 4 oz), SpO2 96.00%.  Intake/Output Summary (Last 24 hours) at 11/18/12 1523 Last data filed at 11/18/12 1423  Gross per 24 hour  Intake   1095 ml  Output   2000 ml  Net   -905 ml     Exam: General: No acute respiratory distress Lungs: Clear to auscultation bilaterally without wheezes or crackles Cardiovascular: Regular rate and rhythm without murmur gallop or rub normal S1 and S2 Abdomen: Nontender, nondistended, soft, bowel sounds positive, no rebound, no ascites, no appreciable mass Extremities: No significant cyanosis, clubbing, or edema bilateral lower extremities  Data Reviewed: Basic Metabolic Panel:  Recent Labs Lab 11/17/12 1820 11/18/12 0605  NA 133* 136  K 3.7 3.0*  CL 95* 99  CO2 27 22  GLUCOSE 122* 139*  BUN 10 11  CREATININE 0.99 0.91  CALCIUM 9.1 8.9   Liver Function Tests: No results found for this basename: AST, ALT, ALKPHOS, BILITOT, PROT, ALBUMIN,  in the last 168 hours No results found for this basename: LIPASE, AMYLASE,  in the last 168 hours No results found for this basename: AMMONIA,  in the last 168 hours CBC:  Recent Labs Lab 11/17/12 1820 11/18/12 0605  WBC 9.7 12.8*  NEUTROABS 7.0  --   HGB 14.8 15.0  HCT 40.7 41.9  MCV 83.4 83.3  PLT 183 184   Cardiac Enzymes:  Recent Labs Lab 11/18/12 0008 11/18/12 0605 11/18/12 1114  TROPONINI <0.30 <0.30 <0.30   BNP (last 3 results) No results found for this basename: PROBNP,  in the last 8760 hours CBG: No results found for this basename: GLUCAP,  in the last 168 hours  Recent Results (from the past 240 hour(s))  MRSA PCR SCREENING     Status: None   Collection Time    11/17/12 11:47 PM      Result Value Range Status   MRSA by PCR NEGATIVE  NEGATIVE Final   Comment:            The GeneXpert MRSA Assay (FDA     approved for NASAL specimens     only), is one  component of a     comprehensive MRSA colonization     surveillance program. It is not     intended to diagnose MRSA     infection nor to guide or     monitor treatment for     MRSA infections.     Studies:  Recent x-ray studies have been reviewed in detail by the Attending Physician  Scheduled Meds:  Scheduled Meds: . aspirin EC  81 mg Oral Daily  . atenolol  25 mg Oral Daily  . hydrochlorothiazide  25 mg Oral Daily  . lisinopril  40 mg Oral Daily  . sodium chloride  3 mL Intravenous Q12H  . valproate sodium  500 mg Intravenous Q8H   Continuous Infusions: . sodium chloride 10 mL/hr (11/18/12 1507)  . niCARDipine 5 mg/hr (11/18/12 1354)    Time spent on care of this patient: 35 minutes   Ryelle Ruvalcaba,MD  [facility] Office  [phone] Pager - Text Page per Loretha Stapler as per below:  On-Call/Text Page:      [email or web address]      password [email or web address]  If 7PM-7AM, please contact night-coverage [email or web address] Password [email or web address] 11/18/2012, 3:23 PM   LOS: 1 day

## 2012-11-19 ENCOUNTER — Encounter (HOSPITAL_COMMUNITY): Payer: Self-pay | Admitting: Radiology

## 2012-11-19 ENCOUNTER — Inpatient Hospital Stay (HOSPITAL_COMMUNITY): Payer: Self-pay

## 2012-11-19 DIAGNOSIS — I6350 Cerebral infarction due to unspecified occlusion or stenosis of unspecified cerebral artery: Principal | ICD-10-CM

## 2012-11-19 DIAGNOSIS — I639 Cerebral infarction, unspecified: Secondary | ICD-10-CM | POA: Diagnosis present

## 2012-11-19 LAB — BASIC METABOLIC PANEL
BUN: 16 mg/dL (ref 6–23)
Calcium: 9 mg/dL (ref 8.4–10.5)
Creatinine, Ser: 1.11 mg/dL (ref 0.50–1.35)
GFR calc Af Amer: 89 mL/min — ABNORMAL LOW (ref >90)
GFR calc non Af Amer: 77 mL/min — ABNORMAL LOW (ref >90)
Glucose, Bld: 103 mg/dL — ABNORMAL HIGH (ref 70–99)
Potassium: 3.3 mEq/L — ABNORMAL LOW (ref 3.5–5.1)

## 2012-11-19 LAB — CBC WITH DIFFERENTIAL/PLATELET
Basophils Absolute: 0 10*3/uL (ref 0.0–0.1)
HCT: 41.8 % (ref 39.0–52.0)
Lymphocytes Relative: 19 % (ref 12–46)
Lymphs Abs: 2.4 10*3/uL (ref 0.7–4.0)
Neutro Abs: 8.5 10*3/uL — ABNORMAL HIGH (ref 1.7–7.7)
Platelets: 192 10*3/uL (ref 150–400)
RBC: 4.98 MIL/uL (ref 4.22–5.81)
RDW: 13 % (ref 11.5–15.5)
WBC: 12.4 10*3/uL — ABNORMAL HIGH (ref 4.0–10.5)

## 2012-11-19 MED ORDER — ASPIRIN EC 81 MG PO TBEC: 81.0000 mg | DELAYED_RELEASE_TABLET | Freq: Every day | ORAL | Status: DC

## 2012-11-19 MED ORDER — HYDRALAZINE HCL 50 MG PO TABS
50.0000 mg | ORAL_TABLET | Freq: Three times a day (TID) | ORAL | Status: DC
  Administered 2012-11-19 – 2012-11-22 (×10): 50 mg via ORAL
  Filled 2012-11-19 (×16): qty 1

## 2012-11-19 MED ORDER — OXYCODONE-ACETAMINOPHEN 5-325 MG PO TABS: 1.0000 | ORAL_TABLET | Freq: Four times a day (QID) | ORAL | Status: DC | PRN

## 2012-11-19 MED ORDER — POTASSIUM CHLORIDE CRYS ER 20 MEQ PO TBCR
40.0000 meq | EXTENDED_RELEASE_TABLET | Freq: Once | ORAL | Status: AC
  Administered 2012-11-19: 40 meq via ORAL
  Filled 2012-11-19: qty 2

## 2012-11-19 NOTE — Progress Notes (Signed)
Called by monitor tech of pt with periods of junctional beats. Pt still confused but sleepy. Family at bedside and updated. Will continue to monitor

## 2012-11-19 NOTE — Progress Notes (Signed)
[facility] Progress Note [place] TEAM 1 - Stepdown/ICU TEAM   Chapman Mcenroe OZH:[id number] DOB: 1965-11-25 DOA: 11/17/2012 PCP: No PCP Per Patient  Brief narrative: 47 yo male healthy comes in with headache and checked bp over the last several days noted to be high. He denies any fevers, n/v/d. No slurred speech or focal neurological deficits. No cp, no sob. No le edema or swelling. Exercises regularly and overall healthy, but htn and strokes run in his family. He initially had sbp 230 on arrival with high diastolic also, given hydralazine in ED with improvement in his bp and also improvement in his headache. No rashes. Pt feels better now, and his sbp is now around 180 dbp 98. Offered LP in ED by EDP but pt deferred that. He has no prior cardiac or cva history.   Assessment/Plan: Principal Problem:   Hypertensive urgency/chronic hypertension Patient does not take blood pressure medication and does not follow with PCP -Currently on nicardipine infusion-Will start lisinopril/HCTZ  -started atenolol at 25 mg but HR dropping to 40s- will need to d/c and start hydralazine insteatd -Has been strongly advised to continue medications-we will make him a followup appointment with the [place] [facility] -Echo reveals grade 1 diastolic dysfunction with a severely dilated left atrium  Transient left facial droop and dysarthria/ CVA  - temporal infarct noted on MRI --have ordered carotid doppler - ECHO reveals no clot - gr 1 diastolic dysfunction noted -check fasting lipid profile in AM  Hypokalemia -Replace again and recheck in the morning - check magn    Code Status: Full code Family Communication: With mother/ brother and daughter- d/w sister on 7/6 Disposition Plan: transfer to tele  Consultants: Neurology  Procedures: None  Antibiotics: None  DVT prophylaxis: Ambulatory  HPI/Subjective: Patient awake and alert-he currently has no complaints tells me he had no  slept in 3 days and was knocked out by the Percocet he received yesterday. No complaints today- have discussed MRI findings with him and his family.  Objective: Blood pressure 161/73, pulse 47, temperature 98.3 F (36.8 C), temperature source Oral, resp. rate 17, height 5\' 11"  (1.803 m), weight 97.523 kg (215 lb), SpO2 94.00%.  Intake/Output Summary (Last 24 hours) at 11/19/12 1513 Last data filed at 11/19/12 0900  Gross per 24 hour  Intake 1201.17 ml  Output    500 ml  Net 701.17 ml     Exam: General: No acute respiratory distress Lungs: Clear to auscultation bilaterally without wheezes or crackles Cardiovascular: Regular rate and rhythm without murmur gallop or rub normal S1 and S2 Abdomen: Nontender, nondistended, soft, bowel sounds positive, no rebound, no ascites, no appreciable mass Extremities: No significant cyanosis, clubbing, or edema bilateral lower extremities  Data Reviewed: Basic Metabolic Panel:  Recent Labs Lab 11/17/12 1820 11/18/12 0605 11/19/12 0538  NA 133* 136 136  K 3.7 3.0* 3.3*  CL 95* 99 99  CO2 27 22 28   GLUCOSE 122* 139* 103*  BUN 10 11 16   CREATININE 0.99 0.91 1.11  CALCIUM 9.1 8.9 9.0   Liver Function Tests: No results found for this basename: AST, ALT, ALKPHOS, BILITOT, PROT, ALBUMIN,  in the last 168 hours No results found for this basename: LIPASE, AMYLASE,  in the last 168 hours No results found for this basename: AMMONIA,  in the last 168 hours CBC:  Recent Labs Lab 11/17/12 1820 11/18/12 0605  WBC 9.7 12.8*  NEUTROABS 7.0  --   HGB 14.8 15.0  HCT 40.7  41.9  MCV 83.4 83.3  PLT 183 184   Cardiac Enzymes:  Recent Labs Lab 11/18/12 0008 11/18/12 0605 11/18/12 1114  TROPONINI <0.30 <0.30 <0.30   BNP (last 3 results) No results found for this basename: PROBNP,  in the last 8760 hours CBG: No results found for this basename: GLUCAP,  in the last 168 hours  Recent Results (from the past 240 hour(s))  MRSA PCR SCREENING      Status: None   Collection Time    11/17/12 11:47 PM      Result Value Range Status   MRSA by PCR NEGATIVE  NEGATIVE Final   Comment:            The GeneXpert MRSA Assay (FDA     approved for NASAL specimens     only), is one component of a     comprehensive MRSA colonization     surveillance program. It is not     intended to diagnose MRSA     infection nor to guide or     monitor treatment for     MRSA infections.     Studies:  Recent x-ray studies have been reviewed in detail by the Attending Physician  Scheduled Meds:  Scheduled Meds: . aspirin EC  81 mg Oral Daily  . hydrALAZINE  50 mg Oral Q8H  . hydrochlorothiazide  25 mg Oral Daily  . lisinopril  40 mg Oral Daily  . sodium chloride  3 mL Intravenous Q12H  . valproate sodium  500 mg Intravenous Q8H   Continuous Infusions: . sodium chloride 10 mL/hr at 11/18/12 2000    Time spent on care of this patient: 35 minutes   Whitnie Deleon,MD  [facility] Office  [phone] Pager - Text Page per Loretha Stapler as per below:  On-Call/Text Page:      [email or web address]      password [email or web address]  If 7PM-7AM, please contact night-coverage [email or web address] Password [email or web address] 11/19/2012, 3:13 PM   LOS: 2 days

## 2012-11-19 NOTE — Progress Notes (Signed)
Upon rounding on pt, he was very groggy. Arousable but would quickly go back to sleep. Per family at bedside, he got up in the chair around 1430 to eat his lunch when he had no complaints. He is back in bed now and he moves all extremities to command but had to be frequently stimulated to complete the assessment. Bp 158/83, o2 sats 95% on RA, resp rate 18, still sinus brady in the 40s to 50s (baseline). He denies any numbness, no facial droop. Dr Butler Denmark paged. Pt recently received prn pain medication for a headache. MD felt this was from the pain medication and orders to continue to let him sleep off medication since this is a reported side effect for the patient. Will closely monitor. Family at bedside and updated

## 2012-11-19 NOTE — Progress Notes (Signed)
Stroke Team Progress Note  HISTORY 47 yo male healthy comes in with headache and elevated bp over the last several days noted to be high. He denies any fevers, n/v/d. No slurred speech or focal neurological deficits. No cp, no sob. No le edema or swelling. Exercises regularly and overall healthy, but htn and strokes run in his family. He initially had sbp 230 on arrival with high diastolic also, given hydralazine in ED with improvement in his bp and also improvement in his headache. No rashes. Pt feels better now, and his sbp is now around 180 dbp 98. Offered LP in ED by EDP but pt deferred that. He has no prior cardiac or cva history   Patient was not a TPA candidate secondary to 3 day hx of HA, incidental finding of stroke on MRI.  SUBJECTIVE His HA significantly improved appears close to baseline.  OBJECTIVE Most recent Vital Signs: Filed Vitals:   11/19/12 0700 11/19/12 0800 11/19/12 0808 11/19/12 0810  BP:    150/87  Pulse: 45 50 53 59  Temp:    98.8 F (37.1 C)  TempSrc:    Oral  Resp:    18  Height:      Weight:      SpO2: 91% 96% 89% 95%   CBG (last 3)  No results found for this basename: GLUCAP,  in the last 72 hours  IV Fluid Intake:   . sodium chloride 10 mL/hr at 11/18/12 2000    MEDICATIONS  . aspirin EC  81 mg Oral Daily  . hydrALAZINE  50 mg Oral Q8H  . hydrochlorothiazide  25 mg Oral Daily  . lisinopril  40 mg Oral Daily  . sodium chloride  3 mL Intravenous Q12H  . valproate sodium  500 mg Intravenous Q8H   PRN:  acetaminophen, acetaminophen, hydrALAZINE, oxyCODONE-acetaminophen  Diet:  Cardiac  Activity:  Bedrest DVT Prophylaxis:  ambulates  CLINICALLY SIGNIFICANT STUDIES Basic Metabolic Panel:  Recent Labs Lab 11/18/12 0605 11/19/12 0538  NA 136 136  K 3.0* 3.3*  CL 99 99  CO2 22 28  GLUCOSE 139* 103*  BUN 11 16  CREATININE 0.91 1.11  CALCIUM 8.9 9.0   Liver Function Tests: No results found for this basename: AST, ALT, ALKPHOS, BILITOT, PROT,  ALBUMIN,  in the last 168 hours CBC:  Recent Labs Lab 11/17/12 1820 11/18/12 0605  WBC 9.7 12.8*  NEUTROABS 7.0  --   HGB 14.8 15.0  HCT 40.7 41.9  MCV 83.4 83.3  PLT 183 184   Coagulation: No results found for this basename: LABPROT, INR,  in the last 168 hours Cardiac Enzymes:  Recent Labs Lab 11/18/12 0008 11/18/12 0605 11/18/12 1114  TROPONINI <0.30 <0.30 <0.30   Urinalysis:  Recent Labs Lab 11/17/12 1926  COLORURINE YELLOW  LABSPEC 1.009  PHURINE 6.5  GLUCOSEU NEGATIVE  HGBUR TRACE*  BILIRUBINUR NEGATIVE  KETONESUR 15*  PROTEINUR NEGATIVE  UROBILINOGEN 0.2  NITRITE NEGATIVE  LEUKOCYTESUR NEGATIVE   Lipid Panel No results found for this basename: chol, trig, hdl, cholhdl, vldl, ldlcalc   HgbA1C  No results found for this basename: HGBA1C    Urine Drug Screen:   No results found for this basename: labopia, cocainscrnur, labbenz, amphetmu, thcu, labbarb    Alcohol Level: No results found for this basename: ETH,  in the last 168 hours  Ct Head Wo Contrast  11/17/2012   *RADIOLOGY REPORT*  Clinical Data: Severe headaches.  CT HEAD WITHOUT CONTRAST  Technique:  Contiguous axial  images were obtained from the base of the skull through the vertex without contrast.  Comparison: None.  Findings: No evidence for acute hemorrhage, mass lesion, midline shift, hydrocephalus or large infarct.  Subtle low density in the left frontal white matter may represent chronic changes.  In general, the white matter is very prominent and this may be related to technical factors.  Normal appearance of the ventricles and ventricles.  Visualized sinuses are clear.  No acute bony abnormality.  IMPRESSION: No acute intracranial abnormality.  Subtle low density in the left frontal white matter.  Findings could represent chronic changes.   Original Report Authenticated By: Richarda Overlie, M.D.   Mr [facility] Wo Contrast  11/18/2012   *RADIOLOGY REPORT*  Clinical Data:  Headache associated with  hypertensive urgency. Transient episode of slurred speech and facial drooping which reportedly resolved.  Currently blood pressure improvement with normal neurologic exam.  MRI HEAD WITHOUT AND WITH CONTRAST MRA HEAD WITHOUT CONTRAST  Technique:  Multiplanar, multiecho pulse sequences of the brain and surrounding structures were obtained without and with intravenous contrast.  Angiographic images of the head were obtained using MRA technique without contrast.  Contrast: 17mL MULTIHANCE GADOBENATE DIMEGLUMINE 529 MG/ML IV SOLN  Comparison:  CT head 11/17/2012.  MRI HEAD WITHOUT AND WITH CONTRAST  Findings:  5 mm area of acute infarction affects the right posterior temporal subcortical white matter (image 16 series 4.). No other areas of acute infarction.  No acute or chronic hemorrhage, mass lesion, hydrocephalus, or extra-axial fluid. Normal cerebral volume.  Premature subcortical greater than periventricular white matter signal representing chronic microvascular ischemic change.  Unusually prominent asymmetric focus of subcortical white matter signal abnormality, more confluent than chronic microvascular ischemic change, suspected vasogenic edema  in the left frontal region (images 15 - 16 series 7) may represent an atypical manifestation of PRES.  No classic areas of vasogenic edema are seen in the parieto-occipital or cerebellar regions.  Attention to this area on followup scanning, possibly a reversible abnormality. No large vessel infarct.  Flow voids are maintained in the major intracranial vessels which are moderately dolichoectatic.  Normal pituitary and cerebellar tonsils.  Upper cervical region unremarkable.  No osseous lesions. No acute sinus or mastoid disease.  Post infusion, there is no abnormal enhancement of the brain or meninges.  IMPRESSION: 5 mm area of acute infarction affecting the right posterior temporal subcortical white matter.  Possible atypical manifestation of PRES with suspected left  frontal subcortical vasogenic edema without restricted or facilitated diffusion.  Premature chronic microvascular ischemic change in the periventricular and subcortical white matter for the patient's age of 70, likely hypertensive related.  MRA HEAD  Findings: Dolichoectatic but widely patent internal carotid arteries.  Dolichoectatic basilar artery with mid basilar irregularity but no flow limiting stenosis.  Both vertebrals are dolichoectatic but appear to contribute equally to the basilar.  Fetal origin left PCA.  No proximal anterior or middle cerebral artery stenosis. No PCA stenosis or occlusion.  No cerebellar branch occlusion.  No visible intracranial aneurysm.  IMPRESSION: Dolichoectatic but otherwise unremarkable intracranial circulation. Mild mid basilar irregularity without flow limiting stenosis.   Original Report Authenticated By: Davonna Belling, M.D.   Mr [facility] Wo Contrast  11/18/2012   *RADIOLOGY REPORT*  Clinical Data:  Headache associated with hypertensive urgency. Transient episode of slurred speech and facial drooping which reportedly resolved.  Currently blood pressure improvement with normal neurologic exam.  MRI HEAD WITHOUT AND WITH CONTRAST MRA HEAD WITHOUT CONTRAST  Technique:  Multiplanar, multiecho pulse sequences of the brain and surrounding structures were obtained without and with intravenous contrast.  Angiographic images of the head were obtained using MRA technique without contrast.  Contrast: 17mL MULTIHANCE GADOBENATE DIMEGLUMINE 529 MG/ML IV SOLN  Comparison:  CT head 11/17/2012.  MRI HEAD WITHOUT AND WITH CONTRAST  Findings:  5 mm area of acute infarction affects the right posterior temporal subcortical white matter (image 16 series 4.). No other areas of acute infarction.  No acute or chronic hemorrhage, mass lesion, hydrocephalus, or extra-axial fluid. Normal cerebral volume.  Premature subcortical greater than periventricular white matter signal representing chronic  microvascular ischemic change.  Unusually prominent asymmetric focus of subcortical white matter signal abnormality, more confluent than chronic microvascular ischemic change, suspected vasogenic edema  in the left frontal region (images 15 - 16 series 7) may represent an atypical manifestation of PRES.  No classic areas of vasogenic edema are seen in the parieto-occipital or cerebellar regions.  Attention to this area on followup scanning, possibly a reversible abnormality. No large vessel infarct.  Flow voids are maintained in the major intracranial vessels which are moderately dolichoectatic.  Normal pituitary and cerebellar tonsils.  Upper cervical region unremarkable.  No osseous lesions. No acute sinus or mastoid disease.  Post infusion, there is no abnormal enhancement of the brain or meninges.  IMPRESSION: 5 mm area of acute infarction affecting the right posterior temporal subcortical white matter.  Possible atypical manifestation of PRES with suspected left frontal subcortical vasogenic edema without restricted or facilitated diffusion.  Premature chronic microvascular ischemic change in the periventricular and subcortical white matter for the patient's age of 89, likely hypertensive related.  MRA HEAD  Findings: Dolichoectatic but widely patent internal carotid arteries.  Dolichoectatic basilar artery with mid basilar irregularity but no flow limiting stenosis.  Both vertebrals are dolichoectatic but appear to contribute equally to the basilar.  Fetal origin left PCA.  No proximal anterior or middle cerebral artery stenosis. No PCA stenosis or occlusion.  No cerebellar branch occlusion.  No visible intracranial aneurysm.  IMPRESSION: Dolichoectatic but otherwise unremarkable intracranial circulation. Mild mid basilar irregularity without flow limiting stenosis.   Original Report Authenticated By: Davonna Belling, M.D.    CT of the brain  No acute intracranial abnormality.   MRI of the brain  5 mm area  of acute infarction affecting the right posterior  temporal subcortical white matter.   MRA of the brain  Dolichoectatic but otherwise unremarkable intracranial circulation.  Mild mid basilar irregularity without flow limiting stenosis   2D Echocardiogram  Left ventricle: The cavity size was normal. Wall thickness was increased in a pattern of moderate to severe LVH. Systolic function was vigorous. The estimated ejection fraction was in the range of 65% to 70%. Wall motion was normal   Carotid Doppler    CXR    EKG  normal EKG, normal sinus rhythm, unchanged from previous tracings.   Therapy Recommendations pending  Physical Exam   Neurologic Exam:  Mental Status:  Alert, oriented, thought content appropriate. Speech fluent without evidence of aphasia. Able to follow 3 step commands without difficulty.  Cranial Nerves:  II: Discs flat bilaterally; Visual fields grossly normal, pupils equal, round, reactive to light  III,IV, VI: ptosis not present, extra-ocular motions intact bilaterally  V,VII: smile symmetric, facial light touch sensation normal bilaterally  VIII: hearing normal bilaterally  IX,X: gag reflex present  XI: bilateral shoulder shrug  XII: midline tongue extension  Motor:  Moves all  limbs spontaneously and symmetrically  Tone and bulk:normal tone throughout; no atrophy noted  Sensory: Pinprick and light touch intact throughout, bilaterally  Deep Tendon Reflexes:  2+ throughout  Plantars:  Right: downgoing Left: downgoing   ASSESSMENT [name] is a 47 y.o. male new onset severe right frontal headache x 3 days, persistently elevated BP, and reported transient droopiness left face and slurred speech. Normal neuro-exam at this moment. CT brain unremarkable. MRI R temporal subcortical infarct. Exam at baseline, HA resolved on VPA.    HA resolved  Hospital day # 2  TREATMENT/PLAN Increase ASA to 325 Carotid doppler Start statin BP control  SBP goal <160 On VPA 500 q8 for HA which improved. Con't Valproic acid for total 3 days at 500 q8 and taper off for 3 days. Think HA related to elevated BP. ( Taper: 500 BID x1 day, 250 BID x1day, 250 daily x1 day then off) HbA1c, lipid panel Pt/OT.  When above work up complete can most likley be d/c from neuro stand point.      11/19/2012 10:39 AM  Pauletta Browns

## 2012-11-20 LAB — BASIC METABOLIC PANEL
BUN: 14 mg/dL (ref 6–23)
Creatinine, Ser: 0.92 mg/dL (ref 0.50–1.35)
GFR calc Af Amer: >90 mL/min (ref >90)
GFR calc non Af Amer: >90 mL/min (ref >90)
Glucose, Bld: 107 mg/dL — ABNORMAL HIGH (ref 70–99)
Potassium: 3.2 mEq/L — ABNORMAL LOW (ref 3.5–5.1)

## 2012-11-20 LAB — LIPID PANEL
HDL: 55 mg/dL (ref >39)
LDL Cholesterol: 77 mg/dL (ref 0–99)
Total CHOL/HDL Ratio: 2.7 RATIO

## 2012-11-20 LAB — HEMOGLOBIN A1C: Hgb A1c MFr Bld: 5.6 % (ref <5.7)

## 2012-11-20 MED ORDER — HYDRALAZINE HCL 20 MG/ML IJ SOLN
INTRAMUSCULAR | Status: AC
  Administered 2012-11-20: 10 mg
  Filled 2012-11-20: qty 1

## 2012-11-20 MED ORDER — NICARDIPINE HCL IN NACL 20-0.86 MG/200ML-% IV SOLN
5.0000 mg/h | INTRAVENOUS | Status: DC
  Administered 2012-11-20: 15 mg/h via INTRAVENOUS
  Administered 2012-11-20: 5 mg/h via INTRAVENOUS
  Filled 2012-11-20 (×3): qty 200

## 2012-11-20 MED ORDER — DIVALPROEX SODIUM ER 500 MG PO TB24
500.0000 mg | ORAL_TABLET | Freq: Every day | ORAL | Status: DC
  Administered 2012-11-20 – 2012-11-24 (×5): 500 mg via ORAL
  Filled 2012-11-20 (×6): qty 1

## 2012-11-20 MED ORDER — NICARDIPINE HCL IN NACL 40-0.83 MG/200ML-% IV SOLN
5.0000 mg/h | INTRAVENOUS | Status: DC
  Administered 2012-11-20 (×3): 15 mg/h via INTRAVENOUS
  Administered 2012-11-21: 5 mg/h via INTRAVENOUS
  Administered 2012-11-21 (×3): 15 mg/h via INTRAVENOUS
  Administered 2012-11-21 – 2012-11-22 (×2): 5 mg/h via INTRAVENOUS
  Filled 2012-11-20 (×11): qty 200

## 2012-11-20 NOTE — Care Management Note (Addendum)
    Page 1 of 1   11/23/2012     8:26:58 AM   CARE MANAGEMENT NOTE 11/23/2012  Patient:  ELMAR, ANTIGUA   Account Number:  [id number]  Date Initiated:  11/20/2012  Documentation initiated by:  Junius Creamer  Subjective/Objective Assessment:   adm w htn crisis     Action/Plan:   lives alone   Anticipated DC Date:     Anticipated DC Plan:        DC Planning Services  CM consult      Choice offered to / List presented to:             Status of service:   Medicare Important Message given?   (If response is "NO", the following Medicare IM given date fields will be blank) Date Medicare IM given:   Date Additional Medicare IM given:    Discharge Disposition:    Per UR Regulation:  Reviewed for med. necessity/level of care/duration of stay  If discussed at Long Length of Stay Meetings, dates discussed:   11/23/2012    Comments:  7/10 0825 debbie Quetzali Heinle rn,bsn spoke w pt. he states he has pcp but can't remember name. his sister was in room and could not remember either but knew practice he went to.

## 2012-11-20 NOTE — Progress Notes (Signed)
Patient lethargic and difficult to arouse.  Once awake, he insists on walking to bathroom to void.  Not following commands when nursing staff asks him to use the urinal at bedside.  Assisted to bathroom by nursing staff to void and back to bed, repeatedly stating "I need to use the bathroom," even after returning from bathroom.  Neuro check completed.  Patient unable to follow commands when asked to perform grips, smile, etc.  When asked his age, he states "43" and later states "27."  When asked what month it is, he states "16."  Rapid response nurse, April, and Dr. Leroy Kennedy notified of patient's expressive aphasia and inability to follow commands.  MD also notified of elevated BP and temp.  Head CT ordered.  Will continue to monitor.  Cameron Eaton

## 2012-11-20 NOTE — Progress Notes (Signed)
Patient becoming more alert and following commands appropriately.  Remains with expressive aphasia when asked questions.  Taking po well.  Meds given with sips of water without difficulty.  Will continue to monitor.  Alonza Bogus

## 2012-11-20 NOTE — Progress Notes (Signed)
Nurse called me earlier to notify changes in patient's responsiveness and inability to follow commands. Rapid response nurse noted that he was having trouble with comprehension and expressing himself. However, when I saw him the exam was non focal and language was appropriate. Febrile 100.3 Requested CT brain was unchanged. CBC ordered. Of note, the above mentioned changes developed after patient receiving Percocet and I surmise this could had cause patient's reported changes.  Cameron Eaton ,MD [facility]

## 2012-11-20 NOTE — Progress Notes (Signed)
[facility] Progress Note    Cameron Eaton ZOX:[id number] DOB: 23-Jun-1965 DOA: 11/17/2012 PCP: No PCP Per Patient  Brief narrative: 47 yo male healthy comes in with headache and checked bp over the last several days noted to be high. He denies any fevers, n/v/d. No slurred speech or focal neurological deficits. No cp, no sob. No le edema or swelling. Exercises regularly and overall healthy, but htn and strokes run in his family. He initially had sbp 230 on arrival with high diastolic also, given hydralazine in ED with improvement in his bp and also improvement in his headache. No rashes. Pt feels better now, and his sbp is now around 180 dbp 98. Offered LP in ED by EDP but pt deferred that. He has no prior cardiac or cva history.   Assessment/Plan:  PRES syndrome -mentation somewhat improved but then  -overnight BP trended up to 200s again and associated mental status changes -d/w NEuro, who recommended Tx to SDU and start NIcardipine gtt for tight BP control SBP120-130 -continue lisinopril/HCTZ  -Has been strongly advised to continue medications- -Echo reveals grade 1 diastolic dysfunction with a severely dilated left atrium  CVA: R posterotemporal infarct MRA noted -ASA 81mg  daily -ECHO as noted above -LDL 77, FU Hbaic -carotid duplex pending  Hypokalemia -Replace again and recheck in the morning   Code Status: Full code Family Communication: With mother/ sister and ex-wife at bedside Disposition Plan: transfer to SDU  Consultants: Neurology  Procedures: None  Antibiotics: None  DVT prophylaxis: Ambulatory  HPI/Subjective: Patient awake and alert-he currently has no complaints tells me he had no slept in 3 days and was knocked out by the Percocet he received yesterday. No complaints today- have discussed MRI findings with him and his family.  Objective: Blood pressure 182/117, pulse 81, temperature 99.7 F (37.6 C), temperature source Oral, resp. rate 20,  height 5\' 11"  (1.803 m), weight 95 kg (209 lb 7 oz), SpO2 95.00%.  Intake/Output Summary (Last 24 hours) at 11/20/12 1241 Last data filed at 11/20/12 1130  Gross per 24 hour  Intake    390 ml  Output    375 ml  Net     15 ml     Exam: General: alert, awake, oriented to self, partly confused Lungs: Clear to auscultation bilaterally without wheezes or crackles Cardiovascular: Regular rate and rhythm without murmur gallop or rub normal S1 and S2 Abdomen: Nontender, nondistended, soft, bowel sounds positive, no rebound, no ascites, no appreciable mass Extremities: No significant cyanosis, clubbing, or edema bilateral lower extremities Neuro: no localsing signs  Data Reviewed: Basic Metabolic Panel:  Recent Labs Lab 11/17/12 1820 11/18/12 0605 11/19/12 0538 11/20/12 0535  NA 133* 136 136 136  K 3.7 3.0* 3.3* 3.2*  CL 95* 99 99 98  CO2 27 22 28 23   GLUCOSE 122* 139* 103* 107*  BUN 10 11 16 14   CREATININE 0.99 0.91 1.11 0.92  CALCIUM 9.1 8.9 9.0 9.3  MG  --   --   --  1.8   Liver Function Tests: No results found for this basename: AST, ALT, ALKPHOS, BILITOT, PROT, ALBUMIN,  in the last 168 hours No results found for this basename: LIPASE, AMYLASE,  in the last 168 hours No results found for this basename: AMMONIA,  in the last 168 hours CBC:  Recent Labs Lab 11/17/12 1820 11/18/12 0605 11/19/12 2223  WBC 9.7 12.8* 12.4*  NEUTROABS 7.0  --  8.5*  HGB 14.8 15.0 15.0  HCT 40.7 41.9  41.8  MCV 83.4 83.3 83.9  PLT 183 184 192   Cardiac Enzymes:  Recent Labs Lab 11/18/12 0008 11/18/12 0605 11/18/12 1114  TROPONINI <0.30 <0.30 <0.30   BNP (last 3 results) No results found for this basename: PROBNP,  in the last 8760 hours CBG: No results found for this basename: GLUCAP,  in the last 168 hours  Recent Results (from the past 240 hour(s))  MRSA PCR SCREENING     Status: None   Collection Time    11/17/12 11:47 PM      Result Value Range Status   MRSA by PCR  NEGATIVE  NEGATIVE Final   Comment:            The GeneXpert MRSA Assay (FDA     approved for NASAL specimens     only), is one component of a     comprehensive MRSA colonization     surveillance program. It is not     intended to diagnose MRSA     infection nor to guide or     monitor treatment for     MRSA infections.     Studies:  Recent x-ray studies have been reviewed in detail by the Attending Physician  Scheduled Meds:  Scheduled Meds: . aspirin EC  81 mg Oral Daily  . divalproex  500 mg Oral Daily  . hydrALAZINE  50 mg Oral Q8H  . hydrochlorothiazide  25 mg Oral Daily  . lisinopril  40 mg Oral Daily  . sodium chloride  3 mL Intravenous Q12H   Continuous Infusions: . sodium chloride 10 mL/hr (11/20/12 1100)  . niCARDipine 5 mg/hr (11/20/12 1130)    Time spent on care of this patient: 35 minutes   Maevyn Riordan,MD [phone]  [facility] Office  [phone] Pager - Text Page per Loretha Stapler as per below:  On-Call/Text Page:      [email or web address]      password [email or web address]  If 7PM-7AM, please contact night-coverage [email or web address] Password [email or web address] 11/20/2012, 12:41 PM   LOS: 3 days

## 2012-11-20 NOTE — Progress Notes (Signed)
Stroke Team Progress Note  HISTORY 47 yo male healthy comes in with headache and elevated bp over the last several days noted to be high. He denies any fevers, n/v/d. No slurred speech or focal neurological deficits. No cp, no sob. No le edema or swelling. Exercises regularly and overall healthy, but htn and strokes run in his family. He initially had sbp 230 on arrival with high diastolic also, given hydralazine in ED with improvement in his bp and also improvement in his headache. No rashes. Pt feels better now, and his sbp is now around 180 dbp 98. Offered LP in ED by EDP but pt deferred that. He has no prior cardiac or cva history   Patient was not a TPA candidate secondary to 3 day hx of HA, incidental finding of stroke on MRI.   SUBJECTIVE His wife is at the bedside. He has had htn for years, but has opted to use diet and exercise to control it.   OBJECTIVE Most recent Vital Signs: Filed Vitals:   11/20/12 0030 11/20/12 0237 11/20/12 0544 11/20/12 0810  BP: 194/117 206/75 217/109 187/64  Pulse: 78 69 62 80  Temp: 99.1 F (37.3 C)  99.3 F (37.4 C) 98.7 F (37.1 C)  TempSrc: Oral  Oral Oral  Resp:   18 18  Height:      Weight:      SpO2: 96%  95% 96%   CBG (last 3)  No results found for this basename: GLUCAP,  in the last 72 hours  IV Fluid Intake:   . sodium chloride 10 mL/hr at 11/18/12 2000    MEDICATIONS  . aspirin EC  81 mg Oral Daily  . hydrALAZINE  50 mg Oral Q8H  . hydrochlorothiazide  25 mg Oral Daily  . lisinopril  40 mg Oral Daily  . sodium chloride  3 mL Intravenous Q12H  . valproate sodium  500 mg Intravenous Q8H   PRN:  acetaminophen, acetaminophen, hydrALAZINE  Diet:  Cardiac  Activity:  Out of bed DVT Prophylaxis:  ambulates  CLINICALLY SIGNIFICANT STUDIES Basic Metabolic Panel:   Recent Labs Lab 11/19/12 0538 11/20/12 0535  NA 136 136  K 3.3* 3.2*  CL 99 98  CO2 28 23  GLUCOSE 103* 107*  BUN 16 14  CREATININE 1.11 0.92  CALCIUM 9.0 9.3   MG  --  1.8   Liver Function Tests: No results found for this basename: AST, ALT, ALKPHOS, BILITOT, PROT, ALBUMIN,  in the last 168 hours CBC:   Recent Labs Lab 11/17/12 1820 11/18/12 0605 11/19/12 2223  WBC 9.7 12.8* 12.4*  NEUTROABS 7.0  --  8.5*  HGB 14.8 15.0 15.0  HCT 40.7 41.9 41.8  MCV 83.4 83.3 83.9  PLT 183 184 192   Coagulation: No results found for this basename: LABPROT, INR,  in the last 168 hours Cardiac Enzymes:   Recent Labs Lab 11/18/12 0008 11/18/12 0605 11/18/12 1114  TROPONINI <0.30 <0.30 <0.30   Urinalysis:   Recent Labs Lab 11/17/12 1926  COLORURINE YELLOW  LABSPEC 1.009  PHURINE 6.5  GLUCOSEU NEGATIVE  HGBUR TRACE*  BILIRUBINUR NEGATIVE  KETONESUR 15*  PROTEINUR NEGATIVE  UROBILINOGEN 0.2  NITRITE NEGATIVE  LEUKOCYTESUR NEGATIVE   Lipid Panel     Component Value Date/Time   CHOL 148 11/20/2012 0535   TRIG 79 11/20/2012 0535   HDL 55 11/20/2012 0535   CHOLHDL 2.7 11/20/2012 0535   VLDL 16 11/20/2012 0535   LDLCALC 77 11/20/2012 0535  HgbA1C  No results found for this basename: HGBA1C    Urine Drug Screen:   No results found for this basename: labopia,  cocainscrnur,  labbenz,  amphetmu,  thcu,  labbarb    Alcohol Level: No results found for this basename: ETH,  in the last 168 hours   CT of the brain  11/19/2012 No acute intracranial abnormality.  MRI of the brain  11/18/2012 5 mm area of acute infarction affecting the right posterior temporal subcortical white matter.  MRA of the brain  11/18/2012 Dolichoectatic but otherwise unremarkable intracranial circulation. Mild mid basilar irregularity without flow limiting stenosis  2D Echocardiogram  Left ventricle: The cavity size was normal. Wall thickness was increased in a pattern of moderate to severe LVH. Systolic function was vigorous. The estimated ejection fraction was in the range of 65% to 70%. Wall motion was normal  Carotid Doppler    CXR    EKG  normal EKG, normal sinus  rhythm, unchanged from previous tracings.   Therapy Recommendations no therapy needs  Physical Exam   Neurologic Exam:  Mental Status:  Alert, oriented x2 , thought content appropriate but slow processing.Mild confusion.Marland Kitchen Speech fluent without evidence of aphasia. Able to follow only 2 step commands without difficulty.  Cranial Nerves:  II: Discs flat bilaterally; Visual fields dense right hemianopsia, pupils equal, round, reactive to light  III,IV, VI: ptosis not present, extra-ocular motions intact bilaterally  V,VII: smile symmetric, facial light touch sensation normal bilaterally  VIII: hearing normal bilaterally  IX,X: gag reflex present  XI: bilateral shoulder shrug  XII: midline tongue extension  Motor:  Moves all limbs spontaneously and symmetrically .impaired knee to heel coordination bilaterally Tone and bulk:normal tone throughout; no atrophy noted  Sensory: Pinprick and light touch intact throughout, bilaterally  Deep Tendon Reflexes:  2+ throughout  Plantars:  Right: downgoing Left: downgoing  ASSESSMENT [name] is a 47 y.o. male presenting with new onset severe right frontal headache x 3 days, persistently elevated BP, and reported transient droopiness left face and slurred speech.  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 Infarct very small and felt to be asymptomatic. 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 On no antithrombotics prior to admission. Now on aspirin 81 mg orally every day for secondary stroke prevention. Patient with resultant HA, word finding difficulties, perseveration, right field cut, confusion - ? Related to dx or side effect of medication.   malginant Hypertension, SBP 230 on admission  Febrile, temp 100.3 LDL 77, not on statins  PTA  Hospital day #  3  TREATMENT/PLAN  Aggressive BP control, goal SBP 120-130. Recommend transfer to the ICU and start cardene drip, Dr. Pearlean Brownie discussed with Dr. Jomarie Longs, patient and wife and answered questions.  F/u Carotid doppler  D/c Statin  Change to depacon 500 ER daily, consider weaning in the future   Annie Main, MSN, RN, ANVP-BC, ANP-BC, GNP-BC [facility] Pager: [phone] 11/20/2012 9:20 AM  I have personally obtained a history, examined the patient, evaluated imaging results, and formulated the assessment and plan of care. I agree with the above. This patient is critically ill and at significant risk of neurological worsening, death and care requires constant monitoring of vital signs, hemodynamics,respiratory and cardiac monitoring,review of multiple databases, neurological assessment, discussion with family, other specialists and medical decision making of high complexity.  I spent 30 minutes of neurocritical care time  in the care of  this patient. Delia Heady, MD

## 2012-11-21 LAB — BASIC METABOLIC PANEL
Chloride: 98 mEq/L (ref 96–112)
GFR calc Af Amer: >90 mL/min (ref >90)
GFR calc non Af Amer: >90 mL/min (ref >90)
Potassium: 2.9 mEq/L — ABNORMAL LOW (ref 3.5–5.1)
Sodium: 135 mEq/L (ref 135–145)

## 2012-11-21 MED ORDER — POTASSIUM CHLORIDE 10 MEQ/100ML IV SOLN
10.0000 meq | INTRAVENOUS | Status: DC
  Administered 2012-11-21: 10 meq via INTRAVENOUS
  Filled 2012-11-21: qty 400

## 2012-11-21 MED ORDER — POTASSIUM CHLORIDE CRYS ER 20 MEQ PO TBCR
60.0000 meq | EXTENDED_RELEASE_TABLET | Freq: Once | ORAL | Status: AC
  Administered 2012-11-21: 60 meq via ORAL
  Filled 2012-11-21: qty 3

## 2012-11-21 MED ORDER — CLONIDINE HCL 0.1 MG PO TABS
0.1000 mg | ORAL_TABLET | Freq: Two times a day (BID) | ORAL | Status: DC
  Administered 2012-11-21 – 2012-11-22 (×3): 0.1 mg via ORAL
  Filled 2012-11-21 (×4): qty 1

## 2012-11-21 MED ORDER — POTASSIUM CHLORIDE CRYS ER 20 MEQ PO TBCR
40.0000 meq | EXTENDED_RELEASE_TABLET | Freq: Once | ORAL | Status: AC
  Administered 2012-11-21: 40 meq via ORAL
  Filled 2012-11-21: qty 2

## 2012-11-21 NOTE — Progress Notes (Addendum)
[facility] Progress Note    Cameron Eaton OZH:[id number] DOB: [birth year] DOA: 11/17/2012 PCP: No PCP Per Patient  TEAM 1 transfer  Brief narrative: 47 yo male healthy comes in with headache, transient left facial droop and dysarthria. He eExercises regularly and overall healthy, but htn and strokes run in his family. He initially had sbp 230 on arrival with high diastolic also, given hydralazine in ED with improvement in his bp and also improvement in his headache. Neuro recommended LP initially to r/o [facility] but pt refused that. He has no prior cardiac or cva history. He was started on Cardene drip on admission to SDU then weaned off this and transferred to [facility] Floor on 7/6 Workup revealed R posterotemporal infarct on MRI, but then early am on 7/6 had mental status changes, decreased responsiveness and inability to follow commands associated with BP in 200s range again and Neurology felt this was PRES since these mental status changes couldn't be explained by his CVA alone. 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 -BP controlled on Nicardipine gtt, also on HCTZ, Hydralazine and Lisinopril at max doses. -will add clonidine this am and work on gradually weaning off Nicardipine gtt -Has been strongly advised to continue medications- -Echo reveals grade 1 diastolic dysfunction with a severely dilated left atrium -check Renal arterial duplex  CVA: R posterotemporal infarct on MRI -MRA noted -ASA 81mg  daily -ECHO as noted above -LDL 77, Hbaic 5.6 -carotid duplex still pending  Hypokalemia -secondary to HCTZ -Replace again and recheck in the morning   Code Status: Full code Family Communication: d/w ex-wife at bedside Disposition Plan:Keep in SDU while on Cardene gtt  Consultants: Neurology  Procedures: None  Antibiotics: None  DVT  prophylaxis: Ambulatory  HPI/Subjective: Feels well, no complaints, mentation back to baseline Objective: Blood pressure 124/77, pulse 74, temperature 98.2 F (36.8 C), temperature source Oral, resp. rate 15, height 5\' 11"  (1.803 m), weight 95 kg (209 lb 7 oz), SpO2 91.00%.  Intake/Output Summary (Last 24 hours) at 11/21/12 0820 Last data filed at 11/21/12 0700  Gross per 24 hour  Intake   2640 ml  Output    725 ml  Net   1915 ml     Exam: General: alert, awake, oriented x3, mentation significantly improved Lungs: Clear to auscultation bilaterally without wheezes or crackles Cardiovascular: Regular rate and rhythm without murmur gallop or rub normal S1 and S2 Abdomen: Nontender, nondistended, soft, bowel sounds positive, no rebound, no ascites, no appreciable mass Extremities: No significant cyanosis, clubbing, or edema bilateral lower extremities Neuro: no localsing signs  Data Reviewed: Basic Metabolic Panel:  Recent Labs Lab 11/17/12 1820 11/18/12 0605 11/19/12 0538 11/20/12 0535 11/21/12 0510  NA 133* 136 136 136 135  K 3.7 3.0* 3.3* 3.2* 2.9*  CL 95* 99 99 98 98  CO2 27 22 28 23 26   GLUCOSE 122* 139* 103* 107* 111*  BUN 10 11 16 14 14   CREATININE 0.99 0.91 1.11 0.92 0.97  CALCIUM 9.1 8.9 9.0 9.3 9.2  MG  --   --   --  1.8  --    Liver Function Tests: No results found for this basename: AST, ALT, ALKPHOS, BILITOT, PROT, ALBUMIN,  in the last 168 hours No results found for this basename: LIPASE, AMYLASE,  in the last 168 hours No results found for this basename: AMMONIA,  in the  last 168 hours CBC:  Recent Labs Lab 11/17/12 1820 11/18/12 0605 11/19/12 2223  WBC 9.7 12.8* 12.4*  NEUTROABS 7.0  --  8.5*  HGB 14.8 15.0 15.0  HCT 40.7 41.9 41.8  MCV 83.4 83.3 83.9  PLT 183 184 192   Cardiac Enzymes:  Recent Labs Lab 11/18/12 0008 11/18/12 0605 11/18/12 1114  TROPONINI <0.30 <0.30 <0.30   BNP (last 3 results) No results found for this basename:  PROBNP,  in the last 8760 hours CBG: No results found for this basename: GLUCAP,  in the last 168 hours  Recent Results (from the past 240 hour(s))  MRSA PCR SCREENING     Status: None   Collection Time    11/17/12 11:47 PM      Result Value Range Status   MRSA by PCR NEGATIVE  NEGATIVE Final   Comment:            The GeneXpert MRSA Assay (FDA     approved for NASAL specimens     only), is one component of a     comprehensive MRSA colonization     surveillance program. It is not     intended to diagnose MRSA     infection nor to guide or     monitor treatment for     MRSA infections.     Studies:  Recent x-ray studies have been reviewed in detail by the Attending Physician  Scheduled Meds:  Scheduled Meds: . aspirin EC  81 mg Oral Daily  . cloNIDine  0.1 mg Oral BID  . divalproex  500 mg Oral Daily  . hydrALAZINE  50 mg Oral Q8H  . hydrochlorothiazide  25 mg Oral Daily  . lisinopril  40 mg Oral Daily  . potassium chloride  10 mEq Intravenous Q1 Hr x 4  . sodium chloride  3 mL Intravenous Q12H   Continuous Infusions: . sodium chloride 10 mL/hr (11/20/12 1100)  . niCARDipine 15 mg/hr (11/21/12 0759)    Time spent on care of this patient: 35 minutes   Marili Vader,MD [phone]  [facility] Office  [phone] Pager - Text Page per Loretha Stapler as per below:  On-Call/Text Page:      [email or web address]      password [email or web address]  If 7PM-7AM, please contact night-coverage [email or web address] Password [email or web address] 11/21/2012, 8:20 AM   LOS: 4 days

## 2012-11-21 NOTE — Progress Notes (Signed)
VASCULAR LAB PRELIMINARY  PRELIMINARY  PRELIMINARY  PRELIMINARY  Carotid duplex  completed.    Preliminary report:  Bilateral:  Less than 39% ICA stenosis.  Vertebral artery flow is antegrade.      Braison Snoke, RVT 11/21/2012, 1:11 PM

## 2012-11-21 NOTE — Progress Notes (Signed)
Stroke Team Progress Note  HISTORY 47 yo male healthy comes in with headache and elevated bp over the last several days noted to be high. He denies any fevers, n/v/d. No slurred speech or focal neurological deficits. No cp, no sob. No le edema or swelling. Exercises regularly and overall healthy, but htn and strokes run in his family. He initially had sbp 230 on arrival with high diastolic also, given hydralazine in ED with improvement in his bp and also improvement in his headache. No rashes. Pt feels better now, and his sbp is now around 180 dbp 98. Offered LP in ED by EDP but pt deferred that. He has no prior cardiac or cva history Patient was not a TPA candidate secondary to 3 day hx of HA, incidental finding of stroke on MRI.   SUBJECTIVE No family at the bedside. Now in the ICU on cardene drip with BP controlled. No longer confused. He does not remember Dr. Pearlean Brownie from yesterday. Mother arrived during rounds.  OBJECTIVE Most recent Vital Signs: Filed Vitals:   11/21/12 0400 11/21/12 0500 11/21/12 0600 11/21/12 0700  BP: 151/70 137/60 148/79 124/77  Pulse:      Temp: 98.2 F (36.8 C)     TempSrc: Oral     Resp:      Height:      Weight:      SpO2: 91%      CBG (last 3)  No results found for this basename: GLUCAP,  in the last 72 hours  IV Fluid Intake:   . sodium chloride 10 mL/hr (11/20/12 1100)  . niCARDipine 15 mg/hr (11/21/12 0759)    MEDICATIONS  . aspirin EC  81 mg Oral Daily  . cloNIDine  0.1 mg Oral BID  . divalproex  500 mg Oral Daily  . hydrALAZINE  50 mg Oral Q8H  . hydrochlorothiazide  25 mg Oral Daily  . lisinopril  40 mg Oral Daily  . potassium chloride  40 mEq Oral Once  . sodium chloride  3 mL Intravenous Q12H   PRN:  acetaminophen, acetaminophen  Diet:  Cardiac  Activity:  Out of bed DVT Prophylaxis:  ambulates  CLINICALLY SIGNIFICANT STUDIES Basic Metabolic Panel:   Recent Labs Lab 11/19/12 0538 11/20/12 0535 11/21/12 0510  NA 136 136 135  K  3.3* 3.2* 2.9*  CL 99 98 98  CO2 28 23 26   GLUCOSE 103* 107* 111*  BUN 16 14 14   CREATININE 1.11 0.92 0.97  CALCIUM 9.0 9.3 9.2  MG  --  1.8  --    Liver Function Tests: No results found for this basename: AST, ALT, ALKPHOS, BILITOT, PROT, ALBUMIN,  in the last 168 hours CBC:   Recent Labs Lab 11/17/12 1820 11/18/12 0605 11/19/12 2223  WBC 9.7 12.8* 12.4*  NEUTROABS 7.0  --  8.5*  HGB 14.8 15.0 15.0  HCT 40.7 41.9 41.8  MCV 83.4 83.3 83.9  PLT 183 184 192   Coagulation: No results found for this basename: LABPROT, INR,  in the last 168 hours Cardiac Enzymes:   Recent Labs Lab 11/18/12 0008 11/18/12 0605 11/18/12 1114  TROPONINI <0.30 <0.30 <0.30   Urinalysis:   Recent Labs Lab 11/17/12 1926  COLORURINE YELLOW  LABSPEC 1.009  PHURINE 6.5  GLUCOSEU NEGATIVE  HGBUR TRACE*  BILIRUBINUR NEGATIVE  KETONESUR 15*  PROTEINUR NEGATIVE  UROBILINOGEN 0.2  NITRITE NEGATIVE  LEUKOCYTESUR NEGATIVE   Lipid Panel     Component Value Date/Time   CHOL 148 11/20/2012 0535  TRIG 79 11/20/2012 0535   HDL 55 11/20/2012 0535   CHOLHDL 2.7 11/20/2012 0535   VLDL 16 11/20/2012 0535   LDLCALC 77 11/20/2012 0535   HgbA1C  Lab Results  Component Value Date   HGBA1C 5.6 11/20/2012    Urine Drug Screen:   No results found for this basename: labopia,  cocainscrnur,  labbenz,  amphetmu,  thcu,  labbarb    Alcohol Level: No results found for this basename: ETH,  in the last 168 hours   CT of the brain  11/19/2012 No acute intracranial abnormality.  MRI of the brain  11/18/2012 5 mm area of acute infarction affecting the right posterior temporal subcortical white matter.  MRA of the brain  11/18/2012 Dolichoectatic but otherwise unremarkable intracranial circulation. Mild mid basilar irregularity without flow limiting stenosis  2D Echocardiogram  Left ventricle: The cavity size was normal. Wall thickness was increased in a pattern of moderate to severe LVH. Systolic function was vigorous.  The estimated ejection fraction was in the range of 65% to 70%. Wall motion was normal  Carotid Doppler    CXR    EKG  normal EKG, normal sinus rhythm, unchanged from previous tracings.   Therapy Recommendations no therapy needs  Physical Exam   Neurologic Exam:  Mental Status:  Alert, oriented x3 , thought content appropriate but slow processing. Marland Kitchen Speech fluent without evidence of aphasia. Able to follow only 2 step commands without difficulty.  Cranial Nerves:  II: Discs flat bilaterally; Visual fields dense right hemianopsia, pupils equal, round, reactive to light  III,IV, VI: ptosis not present, extra-ocular motions intact bilaterally  V,VII: smile symmetric, facial light touch sensation normal bilaterally  VIII: hearing normal bilaterally  IX,X: gag reflex present  XI: bilateral shoulder shrug  XII: midline tongue extension  Motor:  Moves all limbs spontaneously and symmetrically .impaired knee to heel coordination bilaterally Tone and bulk:normal tone throughout; no atrophy noted  Sensory: Pinprick and light touch intact throughout, bilaterally  Deep Tendon Reflexes:  2+ throughout  Plantars:  Right: downgoing Left: downgoing  ASSESSMENT [name] is a 47 y.o. male presenting with new onset severe right frontal headache x 3 days, persistently elevated BP, and reported transient droopiness left face and slurred speech.  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 Infarct very small and felt to be asymptomatic. Given severely elevated BP in setting of neuro symptoms, dx PRES given increased white matter disease. On no antithrombotics prior to admission. Now on aspirin 81 mg orally every day for secondary stroke prevention. Patient with resolved HA, word finding difficulties, perseveration, right field cut, confusion - patient back to baseline.    malginant Hypertension, SBP 230 on admission. Now controlled on cardene.  Febrile, temp 99.7 LDL 77, not  on statins  PTA  Hospital day # 4  TREATMENT/PLAN  Long term SBP goal 120/80  F/u Carotid doppler  Continue depacon 500 ER for now, will readdress stopping tomorrow   Recommend followup with primary MD more frequent than in the past  Annie Main, MSN, RN, ANVP-BC, ANP-BC, GNP-BC [facility] Pager: [phone] 11/21/2012 9:59 AM  I have personally obtained a history, examined the patient, evaluated imaging results, and formulated the assessment and plan of care. I agree with the above.  Delia Heady, MD

## 2012-11-21 NOTE — Evaluation (Signed)
Clinical/Bedside Swallow Evaluation Patient Details  Name: Cameron Eaton MRN: [id number] Date of Birth: [birth year]  Today's Date: 11/21/2012 Time: 1100-1145 SLP Time Calculation (min): 45 min  Past Medical History:  Past Medical History  Diagnosis Date  . Hypertension    Past Surgical History: History reviewed. No pertinent past surgical history. HPI:  47 yo male healthy comes in with headache and elevated bp over the last several days noted to be high. He denies any fevers, n/v/d. No slurred speech or focal neurological deficits. No cp, no sob. No le edema or swelling. Exercises regularly and overall healthy, but htn and strokes run in his family. He initially had sbp 230 on arrival with high diastolic also, given hydralazine in ED with improvement in his bp and also improvement in his headache. No rashes. Pt feels better now, and his sbp is now around 180 dbp 98. Offered LP in ED by EDP but pt deferred that. He has no prior cardiac or cva history Patient was not a TPA candidate secondary to 3 day hx of HA, incidental finding of stroke on MRI.     Assessment / Plan / Recommendation Clinical Impression  Pt. appears to have normal swallow function and has been tolerating a Regular diet with thin liquids.  ? if further cognitive evaluation may be helpful, as pt. stated he only had steps into his house, and was adamant about that when sister challenged him.  Sister then asked, "Where is your bedroom?"  Pt. answered "Upstairs!" Sister reported he has 3 levels in his home, and uses all 3 daily.  Pt's sister is concerned that pt. 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  Pt.'s sister also is concerned that pt. may not f/u with MD appts. or meds, and requests that MD address these issues.  SLP spoke with Annie Main, NP, who will pass along to MD.  NP reports that the cognitive changes/MS issues should resolve within a  week, once the BP is regulated.    Aspiration Risk  Mild    Diet Recommendation Regular;Thin liquid   Liquid Administration via: Straw;Cup Medication Administration: Whole meds with liquid Supervision: Patient able to self feed;Intermittent supervision to cue for compensatory strategies Compensations: Slow rate;Small sips/bites Postural Changes and/or Swallow Maneuvers: Seated upright 90 degrees    Other  Recommendations Recommended Consults:  (Consider Neuropsych eval.) Oral Care Recommendations: Oral care BID;Patient independent with oral care Other Recommendations: Clarify dietary restrictions   Follow Up Recommendations  Other (comment) (Further cognitive evaluation if not resolved.)    Frequency and Duration        Pertinent Vitals/Pain n/a    SLP Swallow Goals     Swallow Study Prior Functional Status       General HPI: 47 yo male healthy comes in with headache and elevated bp over the last several days noted to be high. He denies any fevers, n/v/d. No slurred speech or focal neurological deficits. No cp, no sob. No le edema or swelling. Exercises regularly and overall healthy, but htn and strokes run in his family. He initially had sbp 230 on arrival with high diastolic also, given hydralazine in ED with improvement in his bp and also improvement in his headache. No rashes. Pt feels better now, and his sbp is now around 180 dbp 98. Offered LP in ED by EDP but pt deferred that. He has no prior cardiac or cva history Patient was  not a TPA candidate secondary to 3 day hx of HA, incidental finding of stroke on MRI.   Type of Study: Bedside swallow evaluation Previous Swallow Assessment: None Diet Prior to this Study: Regular;Thin liquids Temperature Spikes Noted: No Respiratory Status: Room air History of Recent Intubation: No Behavior/Cognition: Lethargic;Cooperative;Confused;Requires cueing;Decreased sustained attention Oral Cavity - Dentition: Adequate natural  dentition Self-Feeding Abilities: Able to feed self Patient Positioning: Upright in bed Baseline Vocal Quality: Clear Volitional Cough: Strong Volitional Swallow: Able to elicit    Oral/Motor/Sensory Function Overall Oral Motor/Sensory Function: Appears within functional limits for tasks assessed   Ice Chips     Thin Liquid Thin Liquid: Within functional limits Presentation: Cup;Straw    Nectar Thick Nectar Thick Liquid: Not tested   Honey Thick Honey Thick Liquid: Not tested   Puree Puree: Within functional limits Presentation: Spoon   Solid   GO    Solid: Within functional limits       Maryjo Rochester T 11/21/2012,1:34 PM

## 2012-11-22 ENCOUNTER — Encounter (HOSPITAL_COMMUNITY): Payer: Self-pay | Admitting: Cardiology

## 2012-11-22 ENCOUNTER — Inpatient Hospital Stay (HOSPITAL_COMMUNITY): Payer: MEDICAID

## 2012-11-22 DIAGNOSIS — I10 Essential (primary) hypertension: Secondary | ICD-10-CM

## 2012-11-22 DIAGNOSIS — I119 Hypertensive heart disease without heart failure: Secondary | ICD-10-CM

## 2012-11-22 LAB — BASIC METABOLIC PANEL
BUN: 13 mg/dL (ref 6–23)
CO2: 25 mEq/L (ref 19–32)
Chloride: 104 mEq/L (ref 96–112)
Glucose, Bld: 97 mg/dL (ref 70–99)
Potassium: 3.9 mEq/L (ref 3.5–5.1)
Sodium: 137 mEq/L (ref 135–145)

## 2012-11-22 MED ORDER — METOPROLOL SUCCINATE ER 25 MG PO TB24
25.0000 mg | ORAL_TABLET | Freq: Every day | ORAL | Status: DC
  Administered 2012-11-22 – 2012-11-23 (×2): 25 mg via ORAL
  Filled 2012-11-22 (×3): qty 1

## 2012-11-22 MED ORDER — AMLODIPINE BESYLATE 5 MG PO TABS
5.0000 mg | ORAL_TABLET | Freq: Every day | ORAL | Status: DC
  Administered 2012-11-22 – 2012-11-24 (×3): 5 mg via ORAL
  Filled 2012-11-22 (×4): qty 1

## 2012-11-22 MED ORDER — LISINOPRIL 20 MG PO TABS
20.0000 mg | ORAL_TABLET | Freq: Two times a day (BID) | ORAL | Status: DC
  Administered 2012-11-22 – 2012-11-24 (×5): 20 mg via ORAL
  Filled 2012-11-22 (×6): qty 1

## 2012-11-22 MED ORDER — HYDRALAZINE HCL 50 MG PO TABS
100.0000 mg | ORAL_TABLET | Freq: Three times a day (TID) | ORAL | Status: DC
  Administered 2012-11-22 – 2012-11-24 (×6): 100 mg via ORAL
  Filled 2012-11-22 (×10): qty 2

## 2012-11-22 NOTE — Progress Notes (Signed)
Renal artery duplex completed.  No evidence of renal artery stenosis.

## 2012-11-22 NOTE — Progress Notes (Signed)
Assumed care for this 7a-7p shift. Pt resting in bed, denies pain or discomfort. Family at bedside. cardine gtt infusing as documented. Reviewed plan of care, NPO status, and importance of notifying staff for needs or assistance. Vitals and assessment as documented. Monitoring closely.

## 2012-11-22 NOTE — Consult Note (Signed)
Reason for Consult: Newly Found Severe LVH on  Echo Referring Physician: TRH  HPI: The patient is a 47 y/o AA male, with no PMH , who was admitted on 11/17/12 for hypertensive urgency with SBP in the 200s. In the ED, he was noted to have slurred speech and left sided facial droop. Work up revealed right posterotemporal infarct on MRI, but then early am on 7/6, he had mental status changes, decreased responsiveness and inability to follow commands associated with BP in the 200 range again and Neurology felt this was PRES since these mental status changes couldn't be explained by his CVA alone. He has been followed by Neurology and TRH. An echo was obtained that revealed severe LVH. We have been consulted for cardiac evaluation. The family has specifically asked for [facility]. His BP remains elevated. He denies any recent or past history of chest pain. He never gets SOB. He does not have an established PCP. He was not on any medications prior to this admission. He does not smoke. He has a family history of CAD. His brother had an MI at the age of 24, necessitating placement of 2 stents.   Past Medical History  Diagnosis Date  . Hypertension     History reviewed. No pertinent past surgical history.  Family History  Problem Relation Age of Onset  . Coronary artery disease Brother 9    MI treated with PCI x 2  . CVA Father     Social History:  reports that he has never smoked. He does not have any smokeless tobacco history on file. He reports that he does not drink alcohol or use illicit drugs.  Allergies: No Known Allergies  Medications:  Prior to Admission medications   Medication Sig Start Date End Date Taking? Authorizing Provider  ibuprofen (ADVIL,MOTRIN) 200 MG tablet Take 200 mg by mouth once.    Yes Historical Provider, MD  naproxen sodium (ANAPROX) 220 MG tablet Take 440 mg by mouth once.   Yes Historical Provider, MD     Results for orders placed during the hospital encounter of  11/17/12 (from the past 48 hour(s))  BASIC METABOLIC PANEL     Status: Abnormal   Collection Time    11/21/12  5:10 AM      Result Value Range   Sodium 135  135 - 145 mEq/L   Potassium 2.9 (*) 3.5 - 5.1 mEq/L   Chloride 98  96 - 112 mEq/L   CO2 26  19 - 32 mEq/L   Glucose, Bld 111 (*) 70 - 99 mg/dL   BUN 14  6 - 23 mg/dL   Creatinine, Ser 1.61  0.50 - 1.35 mg/dL   Calcium 9.2  8.4 - 09.6 mg/dL   GFR calc non Af Amer >90  >90 mL/min   GFR calc Af Amer >90  >90 mL/min   Comment:            The eGFR has been calculated     using the CKD EPI equation.     This calculation has not been     validated in all clinical     situations.     eGFR's persistently     <90 mL/min signify     possible Chronic Kidney Disease.  BASIC METABOLIC PANEL     Status: None   Collection Time    11/22/12  4:48 AM      Result Value Range   Sodium 137  135 - 145 mEq/L  Potassium 3.9  3.5 - 5.1 mEq/L   Comment: DELTA CHECK NOTED   Chloride 104  96 - 112 mEq/L   CO2 25  19 - 32 mEq/L   Glucose, Bld 97  70 - 99 mg/dL   BUN 13  6 - 23 mg/dL   Creatinine, Ser 1.61  0.50 - 1.35 mg/dL   Calcium 8.9  8.4 - 09.6 mg/dL   GFR calc non Af Amer >90  >90 mL/min   GFR calc Af Amer >90  >90 mL/min   Comment:            The eGFR has been calculated     using the CKD EPI equation.     This calculation has not been     validated in all clinical     situations.     eGFR's persistently     <90 mL/min signify     possible Chronic Kidney Disease.    No results found.  Review of Systems  Respiratory: Negative for shortness of breath.   Cardiovascular: Negative for chest pain, orthopnea and PND.  Neurological: Positive for speech change and headaches.  All other systems reviewed and are negative.   Blood pressure 150/86, pulse 74, temperature 98.2 F (36.8 C), temperature source Oral, resp. rate 18, height 5\' 11"  (1.803 m), weight 209 lb 7 oz (95 kg), SpO2 96.00%. Physical Exam  Constitutional: He is  oriented to person, place, and time. He appears well-developed and well-nourished. No distress.  Eyes: Conjunctivae and EOM are normal. Pupils are equal, round, and reactive to light.  Neck: No JVD present. No thyromegaly present.  Cardiovascular: Normal rate, regular rhythm and intact distal pulses.   Murmur (2/6 SM) heard. Pulses:      Radial pulses are 2+ on the right side, and 2+ on the left side.       Dorsalis pedis pulses are 2+ on the right side, and 2+ on the left side.  Respiratory: Effort normal and breath sounds normal. No respiratory distress. He has no wheezes. He has no rales. He exhibits no tenderness.  GI: Soft. Bowel sounds are normal. He exhibits no distension and no mass. There is no tenderness.  Musculoskeletal: He exhibits no edema.  Lymphadenopathy:    He has no cervical adenopathy.  Neurological: He is alert and oriented to person, place, and time.  Skin: Skin is warm and dry. He is not diaphoretic.  Psychiatric: He has a normal mood and affect. His behavior is normal.    Assessment/Plan: Principal Problem:   Hypertensive urgency Active Problems:   Headache   Hypertension   CVA (cerebral infarction)  Plan: Admitted for Hypertensive Urgency. S/P posterotemporal infarct on MRI. Found to have severe LVH on recent echo. EKG is consistent with this finding. This is likely secondary to a longstanding history of untreated HTN. Denies hx of chest pain. He remains hypertensive. Currently on Clonidine, HCTZ, lisinopril and hydralazine. ? Adding a BB. Will need to be established with a PCP and cardiologist. Once stable, he may benefit from a NST for risk stratification. Can likely be done as an OP.    Allayne Butcher, PA-C 11/22/2012, 5:06 PM    Patient seen and examined. Agree with assessment and plan. Very pleasant 47 yo active AAM who was admitted with profound hypertension at 230 systolic. MRI has revealed 5 mm area of acute infarction affecting the right   posterior temporal subcortical white matter, ECG reveals LVH with repolarization changes and echo confirms  moderatley severe concentric LVH with nl systolic fxn but grade I diastolic dysfunction. He was treated with IV cardene drip which has been discontinued.  At present he still has stage 2 hypertension with BP 175/94 and P 70. No signs of CHF on exam but he does have an S4 gallop and 2/6 SEM, no edema. At present he is on lisinopril 40 mg daily, HCTZ 25, hydralazine 100 mg tid,  and clonidine .1 mg bid.  Would favor DC clonidine, add amlodipine initially at 5 mg and start Toprol 25 mg and titrate after several doses if needed. I reviewed with him and family the cardiovascular, cerebral and renal risk if BP is not corrected. Will follow.    Lennette Bihari, MD, [facility] 11/22/2012 5:10 PM

## 2012-11-22 NOTE — Progress Notes (Signed)
[facility] Progress Note    Cameron Eaton ZOX:[id number] DOB: [birth year] DOA: 11/17/2012 PCP: No PCP Per Patient  TEAM 1 transfer  Brief narrative: 47 yo healthy male comes in with headache, transient left facial droop and dysarthria.  He exercises regularly and overall healthy, but htn and strokes run in his family. He initially had sbp 230 on arrival with high diastolic also, given hydralazine in ED with improvement in his bp and also improvement in his headache. Neuro recommended LP initially to r/o [facility] but pt refused that. He has no prior cardiac or cva history.  He was started on Cardene drip on admission to SDU then weaned off this and transferred to [facility] Floor on 7/6  Workup revealed R posterotemporal infarct on MRI, but then early am on 7/6 had mental status changes, decreased responsiveness and inability to follow commands associated with BP in 200s range again and Neurology felt this was PRES since these mental status changes couldn't be explained by his CVA alone. Transferred to SDU again and restarted on Nicardipine gtt for tight BP control per Neuro recs   Assessment/Plan:  Atypical PRES syndrome/HTN urgency -mentation improved and at baseline now. -BP improved so Nicardipine gtt weaned to off -cont HCTZ, increase Hydralazine dose and Lisinopril frequency -continue clonidine but consider dc in favor of Norvasc in preparation for d/c  -Has been strongly advised to continue medications-he plans on establishing with a PCP and endorses will not have any problems obtaining prescription meds -Echo revealed grade 1 diastolic dysfunction with a severely dilated left atrium -Renal arterial duplex no evidence of RAS but there was an abnormal intrarenal resistive index on left and an anhypoechoic area in the mid pole of the leftrenal cortex of unknown etiology - will investigate w/ renal US  Severe LVH and Grade 1 diastolic dysfunction -new finding this admit - due to  uncontrolled HTN -stable and compensated but family requests cardiology consult/SEHVC  CVA: 5mm R posterotemporal infarct on MRI -MRA noted -ASA 81mg  daily -ECHO as noted above -LDL 77, Hbaic 5.6 -carotid duplex < 39% bilateral stenosis -has been followed by Stroke team  Hypokalemia -secondary to HCTZ -stable but < 4.0 so may require regular KCL after dc   Code Status: FULL Family Communication: Dr. Jomarie Longs d/w ex-wife at bedside 7/8 Disposition Plan: Transfer to telemetry  Consultants: Neurology [facility]  Procedures: 2D ECHO - Left ventricle: The cavity size was normal. Wall thickness was increased in a pattern of moderate to severe LVH. Systolic function was vigorous. The estimated ejection fraction was in the range of 65% to 70%. Wall motion was normal; there were no regional wall motion abnormalities. Doppler parameters are consistent with abnormal left ventricular relaxation (grade 1 diastolic dysfunction). - Aortic valve: Mild regurgitation. - Left atrium: The atrium was severely dilated.  Carotid Duplex The vertebral arteries appear patent with antegrade flow. - Findings consistent with less than 39 percent stenosis involving the right internal carotid artery and the left internal carotid artery.  Renal Artery Duplex - RAR is 0.59 on the right and 1.12 on the left. - No evidence of renal artery stenosis noted bilaterally. - Normal intrarenal resistive index on right. - Abnormal intrarenal resistive index on left.There is anhypoechoic area in the mid pole of the leftrenal cortex of unknown etiology.  Antibiotics: None  DVT prophylaxis: Ambulatory  HPI/Subjective: No complaints. Denies chest pain or shortness of breath. Hungry and wants to eat.  Objective: Blood pressure 193/116, pulse 74, temperature 98.2 F (36.8 C),  temperature source Oral, resp. rate 18, height 5\' 11"  (1.803 m), weight 95 kg (209 lb 7 oz), SpO2 94.00%.  Intake/Output Summary (Last 24  hours) at 11/22/12 1420 Last data filed at 11/22/12 1400  Gross per 24 hour  Intake 1145.42 ml  Output      0 ml  Net 1145.42 ml     Exam: General: alert, awake, oriented x3, mentation significantly improved Lungs: Clear to auscultation bilaterally without wheezes or crackles Cardiovascular: Regular rate and rhythm without murmur gallop or rub normal S1 and S2 Abdomen: Nontender, nondistended, soft, bowel sounds positive, no rebound, no ascites, no appreciable mass Extremities: No significant cyanosis, clubbing, or edema bilateral lower extremities Neuro: Alert and oriented x3, moves all extremities x4, cranial nerves II through XII appear grossly intact  Data Reviewed: Basic Metabolic Panel:  Recent Labs Lab 11/18/12 0605 11/19/12 0538 11/20/12 0535 11/21/12 0510 11/22/12 0448  NA 136 136 136 135 137  K 3.0* 3.3* 3.2* 2.9* 3.9  CL 99 99 98 98 104  CO2 22 28 23 26 25   GLUCOSE 139* 103* 107* 111* 97  BUN 11 16 14 14 13   CREATININE 0.91 1.11 0.92 0.97 0.91  CALCIUM 8.9 9.0 9.3 9.2 8.9  MG  --   --  1.8  --   --    CBC:  Recent Labs Lab 11/17/12 1820 11/18/12 0605 11/19/12 2223  WBC 9.7 12.8* 12.4*  NEUTROABS 7.0  --  8.5*  HGB 14.8 15.0 15.0  HCT 40.7 41.9 41.8  MCV 83.4 83.3 83.9  PLT 183 184 192   Cardiac Enzymes:  Recent Labs Lab 11/18/12 0008 11/18/12 0605 11/18/12 1114  TROPONINI <0.30 <0.30 <0.30    Recent Results (from the past 240 hour(s))  MRSA PCR SCREENING     Status: None   Collection Time    11/17/12 11:47 PM      Result Value Range Status   MRSA by PCR NEGATIVE  NEGATIVE Final   Comment:            The GeneXpert MRSA Assay (FDA     approved for NASAL specimens     only), is one component of a     comprehensive MRSA colonization     surveillance program. It is not     intended to diagnose MRSA     infection nor to guide or     monitor treatment for     MRSA infections.     Studies:  Recent x-ray studies have been reviewed in  detail by the Attending Physician  Scheduled Meds:  Scheduled Meds: . aspirin EC  81 mg Oral Daily  . cloNIDine  0.1 mg Oral BID  . divalproex  500 mg Oral Daily  . hydrALAZINE  100 mg Oral Q8H  . hydrochlorothiazide  25 mg Oral Daily  . lisinopril  20 mg Oral BID  . sodium chloride  3 mL Intravenous Q12H    Time spent on care of this patient: 35 minutes   ELLIS,ALLISON L., ANP [phone]  [facility] Office  [phone] Pager - Text Page per Loretha Stapler as per below:  On-Call/Text Page:      [email or web address]      password [email or web address]  If 7PM-7AM, please contact night-coverage [email or web address] Password [email or web address] 11/22/2012, 2:20 PM   LOS: 5 days   I have personally examined this patient and reviewed the entire database. I have reviewed the above note, made any necessary editorial changes, and agree with its content.  Tinnie Gens  Silvestre Gunner, MD [facility]

## 2012-11-22 NOTE — Progress Notes (Signed)
Stroke Team Progress Note  HISTORY 47 yo male healthy comes in with headache and elevated bp over the last several days noted to be high. He denies any fevers, n/v/d. No slurred speech or focal neurological deficits. No cp, no sob. No le edema or swelling. Exercises regularly and overall healthy, but htn and strokes run in his family. He initially had sbp 230 on arrival with high diastolic also, given hydralazine in ED with improvement in his bp and also improvement in his headache. No rashes. Pt feels better now, and his sbp is now around 180 dbp 98. Offered LP in ED by EDP but pt deferred that. He has no prior cardiac or cva history Patient was not a TPA candidate secondary to 3 day hx of HA, incidental finding of stroke on MRI.   SUBJECTIVE No family at the bedside. Now in the ICU on cardene drip with BP controlled. No longer confused. He does not remember Dr. Pearlean Brownie from yesterday. Mother arrived during rounds.  OBJECTIVE Most recent Vital Signs: Filed Vitals:   11/22/12 0700 11/22/12 0719 11/22/12 0800 11/22/12 0900  BP: 154/75  134/91 187/92  Pulse:      Temp:  98.7 F (37.1 C)    TempSrc:  Oral    Resp:  18    Height:      Weight:      SpO2:  94%     CBG (last 3)  No results found for this basename: GLUCAP,  in the last 72 hours  IV Fluid Intake:   . sodium chloride 10 mL/hr (11/20/12 1100)    MEDICATIONS  . aspirin EC  81 mg Oral Daily  . cloNIDine  0.1 mg Oral BID  . divalproex  500 mg Oral Daily  . hydrALAZINE  100 mg Oral Q8H  . hydrochlorothiazide  25 mg Oral Daily  . lisinopril  20 mg Oral BID  . sodium chloride  3 mL Intravenous Q12H   PRN:  acetaminophen, acetaminophen  Diet:  Cardiac  Activity:  Out of bed DVT Prophylaxis:  ambulates  CLINICALLY SIGNIFICANT STUDIES Basic Metabolic Panel:   Recent Labs Lab 11/19/12 0538 11/20/12 0535 11/21/12 0510 11/22/12 0448  NA 136 136 135 137  K 3.3* 3.2* 2.9* 3.9  CL 99 98 98 104  CO2 28 23 26 25   GLUCOSE  103* 107* 111* 97  BUN 16 14 14 13   CREATININE 1.11 0.92 0.97 0.91  CALCIUM 9.0 9.3 9.2 8.9  MG  --  1.8  --   --    Liver Function Tests: No results found for this basename: AST, ALT, ALKPHOS, BILITOT, PROT, ALBUMIN,  in the last 168 hours CBC:   Recent Labs Lab 11/17/12 1820 11/18/12 0605 11/19/12 2223  WBC 9.7 12.8* 12.4*  NEUTROABS 7.0  --  8.5*  HGB 14.8 15.0 15.0  HCT 40.7 41.9 41.8  MCV 83.4 83.3 83.9  PLT 183 184 192   Coagulation: No results found for this basename: LABPROT, INR,  in the last 168 hours Cardiac Enzymes:   Recent Labs Lab 11/18/12 0008 11/18/12 0605 11/18/12 1114  TROPONINI <0.30 <0.30 <0.30   Urinalysis:   Recent Labs Lab 11/17/12 1926  COLORURINE YELLOW  LABSPEC 1.009  PHURINE 6.5  GLUCOSEU NEGATIVE  HGBUR TRACE*  BILIRUBINUR NEGATIVE  KETONESUR 15*  PROTEINUR NEGATIVE  UROBILINOGEN 0.2  NITRITE NEGATIVE  LEUKOCYTESUR NEGATIVE   Lipid Panel     Component Value Date/Time   CHOL 148 11/20/2012 0535   TRIG  79 11/20/2012 0535   HDL 55 11/20/2012 0535   CHOLHDL 2.7 11/20/2012 0535   VLDL 16 11/20/2012 0535   LDLCALC 77 11/20/2012 0535   HgbA1C  Lab Results  Component Value Date   HGBA1C 5.6 11/20/2012    Urine Drug Screen:   No results found for this basename: labopia,  cocainscrnur,  labbenz,  amphetmu,  thcu,  labbarb    Alcohol Level: No results found for this basename: ETH,  in the last 168 hours   CT of the brain  11/19/2012 No acute intracranial abnormality.  MRI of the brain  11/18/2012 5 mm area of acute infarction affecting the right posterior temporal subcortical white matter.  MRA of the brain  11/18/2012 Dolichoectatic but otherwise unremarkable intracranial circulation. Mild mid basilar irregularity without flow limiting stenosis  2D Echocardiogram  Left ventricle: The cavity size was normal. Wall thickness was increased in a pattern of moderate to severe LVH. Systolic function was vigorous. The estimated ejection fraction was  in the range of 65% to 70%. Wall motion was normal  Carotid Doppler  No evidence of hemodynamically significant internal carotid artery stenosis. Vertebral artery flow is antegrade.   CXR    EKG  normal EKG, normal sinus rhythm, unchanged from previous tracings.   Therapy Recommendations no therapy needs  Physical Exam   Neurologic Exam:  Mental Status:  Alert, oriented x3 , thought content appropriate but slow processing. Marland Kitchen Speech fluent without evidence of aphasia. Able to follow only 2 step commands without difficulty.  Cranial Nerves:  II: Discs flat bilaterally; Visual fields dense right hemianopsia, pupils equal, round, reactive to light  III,IV, VI: ptosis not present, extra-ocular motions intact bilaterally  V,VII: smile symmetric, facial light touch sensation normal bilaterally  VIII: hearing normal bilaterally  IX,X: gag reflex present  XI: bilateral shoulder shrug  XII: midline tongue extension  Motor:  Moves all limbs spontaneously and symmetrically .impaired knee to heel coordination bilaterally Tone and bulk:normal tone throughout; no atrophy noted  Sensory: Pinprick and light touch intact throughout, bilaterally  Deep Tendon Reflexes:  2+ throughout  Plantars:  Right: downgoing Left: downgoing  ASSESSMENT [name] is a 47 y.o. male presenting with new onset severe right frontal headache x 3 days, persistently elevated BP, and reported transient droopiness left face and slurred speech.  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 Infarct very small and felt to be asymptomatic. Given severely elevated BP in setting of neuro symptoms, dx PRES given increased white matter disease. On no antithrombotics prior to admission. Now on aspirin 81 mg orally every day for secondary stroke prevention. Patient with resolved HA, word finding difficulties, perseveration, right field cut, confusion - patient almost back to baseline - is having high-level  cognitive difficulties which should totally resolve within one week.   malginant Hypertension, SBP 230 on admission. Now controlled. Cardene discontinued. On oral medications  Febrile, temp 99.7 LDL 77, not on statins  PTA  Hospital day # 5  TREATMENT/PLAN  Long term SBP goal 120/80  Given headache control, will ontinue depacon 500 ER for now, will readdress stopping as an OP, will not need long term  Recommend followup with primary MD more frequent than in the past  Educated patient on high importance of maintaining blood pressure as he is sensitive to elevations in his confusion may return in the event of noncontrol  Recommend no work x1 week, minimum  Dr. Pearlean Brownie in 2 months  Stroke service will sign  off. No further workup indicated.  Annie Main, MSN, RN, ANVP-BC, ANP-BC, [facility] Pager: [phone] 11/22/2012 9:58 AM  I have personally obtained a history, examined the patient, evaluated imaging results, and formulated the assessment and plan of care. I agree with the above. Delia Heady, MD

## 2012-11-22 NOTE — Progress Notes (Signed)
SBAR report given to [employer].

## 2012-11-22 NOTE — Progress Notes (Signed)
Report called to receiving RN Shanda Bumps. Patient with no complaints at current time. Will transfer via WC to [id number].

## 2012-11-23 LAB — BASIC METABOLIC PANEL
BUN: 16 mg/dL (ref 6–23)
CO2: 23 mEq/L (ref 19–32)
Chloride: 102 mEq/L (ref 96–112)
Creatinine, Ser: 1.07 mg/dL (ref 0.50–1.35)
GFR calc Af Amer: >90 mL/min (ref >90)
Glucose, Bld: 98 mg/dL (ref 70–99)
Potassium: 3.9 mEq/L (ref 3.5–5.1)

## 2012-11-23 MED ORDER — METOPROLOL SUCCINATE ER 50 MG PO TB24
50.0000 mg | ORAL_TABLET | Freq: Every day | ORAL | Status: DC
Start: 2012-11-24 — End: 2012-11-24
  Administered 2012-11-24: 50 mg via ORAL
  Filled 2012-11-23: qty 1

## 2012-11-23 NOTE — Progress Notes (Signed)
[facility]  Subjective: No complaints. Denies CP/SOB. No HA.   Objective: Vital signs in last 24 hours: Temp:  [97.7 F (36.5 C)-99.5 F (37.5 C)] 98 F (36.7 C) (07/10 0949) Pulse Rate:  [68-80] 75 (07/10 0949) Resp:  [18] 18 (07/10 0400) BP: (135-193)/(77-116) 147/89 mmHg (07/10 0949) SpO2:  [93 %-98 %] 98 % (07/10 0949) Weight:  [215 lb (97.523 kg)] 215 lb (97.523 kg) (07/10 0400) Last BM Date: 11/18/12  Intake/Output from previous day: 07/09 0701 - 07/10 0700 In: 462.9 [P.O.:420; I.V.:42.9] Out: -  Intake/Output this shift: Total I/O In: 240 [P.O.:240] Out: -   Medications Current Facility-Administered Medications  Medication Dose Route Frequency Provider Last Rate Last Dose  . 0.9 %  sodium chloride infusion   Intravenous Continuous Calvert Cantor, MD 5 mL/hr at 11/20/12 1100 10 mL/hr at 11/20/12 1100  . acetaminophen (TYLENOL) tablet 650 mg  650 mg Oral Q6H PRN Tarry Kos, MD   650 mg at 11/19/12 2308   Or  . acetaminophen (TYLENOL) suppository 650 mg  650 mg Rectal Q6H PRN Tarry Kos, MD      . amLODipine (NORVASC) tablet 5 mg  5 mg Oral Daily Brittainy Simmons, PA-C   5 mg at 11/23/12 0908  . aspirin EC tablet 81 mg  81 mg Oral Daily Tarry Kos, MD   81 mg at 11/23/12 0909  . divalproex (DEPAKOTE ER) 24 hr tablet 500 mg  500 mg Oral Daily Layne Benton, NP   500 mg at 11/23/12 0910  . hydrALAZINE (APRESOLINE) tablet 100 mg  100 mg Oral Q8H Lonia Blood, MD   100 mg at 11/23/12 0647  . hydrochlorothiazide (HYDRODIURIL) tablet 25 mg  25 mg Oral Daily Calvert Cantor, MD   25 mg at 11/23/12 0910  . lisinopril (PRINIVIL,ZESTRIL) tablet 20 mg  20 mg Oral BID Lonia Blood, MD   20 mg at 11/23/12 0911  . metoprolol succinate (TOPROL-XL) 24 hr tablet 25 mg  25 mg Oral Daily Brittainy Simmons, PA-C   25 mg at 11/23/12 0912  . sodium chloride 0.9 % injection 3 mL  3 mL Intravenous Q12H Tarry Kos, MD   3 mL at 11/23/12 0914     PE: General appearance: alert, cooperative and no distress Lungs: clear to auscultation bilaterally Heart: regular rate and rhythm, S4 present and 2/6 SEM Extremities: no LEE Pulses: 2+ and symmetric Skin: warm and dry Neurologic: Grossly normal  Lab Results:  No results found for this basename: WBC, HGB, HCT, PLT,  in the last 72 hours BMET  Recent Labs  11/21/12 0510 11/22/12 0448 11/23/12 0505  NA 135 137 139  K 2.9* 3.9 3.9  CL 98 104 102  CO2 26 25 23   GLUCOSE 111* 97 98  BUN 14 13 16   CREATININE 0.97 0.91 1.07  CALCIUM 9.2 8.9 9.4     Assessment/Plan  Principal Problem:   Hypertensive urgency Active Problems:   Headache   Hypertension   CVA (cerebral infarction)  Plan: BP is better controlled today after medication adjustments. Clonidine was discontinued. Amlodipine and Lopressor were both initiated. Most recent BP is 147/89. HR is stable in the mid 60s. NSR. No chest pain or SOB. Continue with current medications. May need further adjustment as an OP.     LOS: 6 days    Brittainy M. Delmer Islam 11/23/2012 10:05 AM   Patient seen and examined. Agree with assessment and plan. He feels better. No  CP, SOB or edema. BP is much improved. Resting P is 75. Will advance Toprol to 50 mg daily beginning tomorrow.   Lennette Bihari, MD, [facility] 11/23/2012 1:44 PM

## 2012-11-23 NOTE — Progress Notes (Signed)
[facility] Progress Note    Pedram Rendell ZOX:[id number] DOB: March 06, 1966 DOA: 11/17/2012 PCP: No PCP Per Patient  TEAM 1 transfer  Brief narrative: 47 yo healthy male comes in with headache, transient left facial droop and dysarthria.  He exercises regularly and overall healthy, but htn and strokes run in his family. He initially had sbp 230 on arrival with high diastolic also, given hydralazine in ED with improvement in his bp and also improvement in his headache. Neuro recommended LP initially to r/o [facility] but pt refused that. He has no prior cardiac or cva history.  He was started on Cardene drip on admission to SDU then weaned off this and transferred to [facility] Floor on 7/6  Workup revealed R posterotemporal infarct on MRI, but then early am on 7/6 had mental status changes, decreased responsiveness and inability to follow commands associated with BP in 200s range again and Neurology felt this was PRES since these mental status changes couldn't be explained by his CVA alone. Transferred to SDU again and restarted on Nicardipine gtt for tight BP control per Neuro recs   Assessment/Plan:  Atypical PRES syndrome/HTN urgency -mentation improved and at baseline now. -BP improved so Nicardipine gtt weaned to off -cont HCTZ, increase Hydralazine dose and Lisinopril frequency -continue clonidine but consider dc in favor of Norvasc in preparation for d/c  -Has been strongly advised to continue medications-he plans on establishing with a PCP and endorses will not have any problems obtaining prescription meds -Echo revealed grade 1 diastolic dysfunction with a severely dilated left atrium -Renal arterial duplex no evidence of RAS but there was an abnormal intrarenal resistive index on left and an anhypoechoic area in the mid pole of the leftrenal cortex of unknown etiology - Severe LVH and Grade 1 diastolic dysfunction -new finding this admit - due to uncontrolled HTN -stable and  compensated but family requests cardiology consult/SEHVC - cardiology consulted recommended changes in medical therapy.  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 Jomarie Longs d/w ex-wife at bedside 7/8 Disposition Plan: possible d/c in am.   Consultants: Neurology [facility]  Procedures: 2D ECHO - Left ventricle: The cavity size was normal. Wall thickness was increased in a pattern of moderate to severe LVH. Systolic function was vigorous. The estimated ejection fraction was in the range of 65% to 70%. Wall motion was normal; there were no regional wall motion abnormalities. Doppler parameters are consistent with abnormal left ventricular relaxation (grade 1 diastolic dysfunction). - Aortic valve: Mild regurgitation. - Left atrium: The atrium was severely dilated.  Carotid Duplex The vertebral arteries appear patent with antegrade flow. - Findings consistent with less than 39 percent stenosis involving the right internal carotid artery and the left internal carotid artery.  Renal Artery Duplex - RAR is 0.59 on the right and 1.12 on the left. - No evidence of renal artery stenosis noted bilaterally. - Normal intrarenal resistive index on right. - Abnormal intrarenal resistive index on left.There is anhypoechoic area in the mid pole of the leftrenal cortex of unknown etiology.  Antibiotics: None  DVT prophylaxis: Ambulatory  HPI/Subjective: No complaints. Denies chest pain or shortness of breath. Wants to go home. Objective: Blood pressure 142/89, pulse 64, temperature 97.6 F (  36.4 C), temperature source Oral, resp. rate 18, height 5\' 11"  (1.803 m), weight 97.523 kg (215 lb), SpO2  94.00%.  Intake/Output Summary (Last 24 hours) at 11/23/12 1758 Last data filed at 11/23/12 0840  Gross per 24 hour  Intake    240 ml  Output      0 ml  Net    240 ml     Exam: General: alert, awake, oriented x3, mentation significantly improved Lungs: Clear to auscultation bilaterally without wheezes or crackles Cardiovascular: Regular rate and rhythm without murmur gallop or rub normal S1 and S2 Abdomen: Nontender, nondistended, soft, bowel sounds positive, no rebound, no ascites, no appreciable mass Extremities: No significant cyanosis, clubbing, or edema bilateral lower extremities Neuro: Alert and oriented x3, moves all extremities x4, cranial nerves II through XII appear grossly intact  Data Reviewed: Basic Metabolic Panel:  Recent Labs Lab 11/19/12 0538 11/20/12 0535 11/21/12 0510 11/22/12 0448 11/23/12 0505  NA 136 136 135 137 139  K 3.3* 3.2* 2.9* 3.9 3.9  CL 99 98 98 104 102  CO2 28 23 26 25 23   GLUCOSE 103* 107* 111* 97 98  BUN 16 14 14 13 16   CREATININE 1.11 0.92 0.97 0.91 1.07  CALCIUM 9.0 9.3 9.2 8.9 9.4  MG  --  1.8  --   --   --    CBC:  Recent Labs Lab 11/17/12 1820 11/18/12 0605 11/19/12 2223  WBC 9.7 12.8* 12.4*  NEUTROABS 7.0  --  8.5*  HGB 14.8 15.0 15.0  HCT 40.7 41.9 41.8  MCV 83.4 83.3 83.9  PLT 183 184 192   Cardiac Enzymes:  Recent Labs Lab 11/18/12 0008 11/18/12 0605 11/18/12 1114  TROPONINI <0.30 <0.30 <0.30    Recent Results (from the past 240 hour(s))  MRSA PCR SCREENING     Status: None   Collection Time    11/17/12 11:47 PM      Result Value Range Status   MRSA by PCR NEGATIVE  NEGATIVE Final   Comment:            The GeneXpert MRSA Assay (FDA     approved for NASAL specimens     only), is one component of a     comprehensive MRSA colonization     surveillance program. It is not     intended to diagnose MRSA     infection nor to guide or     monitor treatment for     MRSA infections.      Studies:  Recent x-ray studies have been reviewed in detail by the Attending Physician  Scheduled Meds:  Scheduled Meds: . amLODipine  5 mg Oral Daily  . aspirin EC  81 mg Oral Daily  . divalproex  500 mg Oral Daily  . hydrALAZINE  100 mg Oral Q8H  . hydrochlorothiazide  25 mg Oral Daily  . lisinopril  20 mg Oral BID  . [START ON 11/24/2012] metoprolol succinate  50 mg Oral Daily  . sodium chloride  3 mL Intravenous Q12H    Time spent on care of this patient: 35 minutes   Dawanna Grauberger,  [phone] [facility] Office  [phone] Pager - Text Page per Loretha Stapler as per below:   If 7PM-7AM, please contact night-coverage [email or web address] Password [email or web address] 11/23/2012, 5:58 PM   LOS: 6 days

## 2012-11-24 ENCOUNTER — Encounter (HOSPITAL_COMMUNITY): Payer: Self-pay | Admitting: Cardiology

## 2012-11-24 DIAGNOSIS — Z8249 Family history of ischemic heart disease and other diseases of the circulatory system: Secondary | ICD-10-CM

## 2012-11-24 DIAGNOSIS — R9431 Abnormal electrocardiogram [ECG] [EKG]: Secondary | ICD-10-CM | POA: Diagnosis present

## 2012-11-24 MED ORDER — AMLODIPINE BESYLATE 5 MG PO TABS: 5.0000 mg | ORAL_TABLET | Freq: Every day | ORAL | Status: DC

## 2012-11-24 MED ORDER — DIVALPROEX SODIUM ER 500 MG PO TB24: 500.0000 mg | ORAL_TABLET | Freq: Every day | ORAL | Status: DC

## 2012-11-24 MED ORDER — METOPROLOL SUCCINATE ER 50 MG PO TB24: 50.0000 mg | ORAL_TABLET | Freq: Every day | ORAL | Status: DC

## 2012-11-24 MED ORDER — HYDRALAZINE HCL 100 MG PO TABS: 100.0000 mg | ORAL_TABLET | Freq: Three times a day (TID) | ORAL | Status: AC

## 2012-11-24 MED ORDER — LISINOPRIL 20 MG PO TABS: 20.0000 mg | ORAL_TABLET | Freq: Two times a day (BID) | ORAL | Status: AC

## 2012-11-24 MED ORDER — HYDROCHLOROTHIAZIDE 25 MG PO TABS: 25.0000 mg | ORAL_TABLET | Freq: Every day | ORAL | Status: AC

## 2012-11-24 MED ORDER — ASPIRIN 81 MG PO TBEC: 81.0000 mg | DELAYED_RELEASE_TABLET | Freq: Every day | ORAL | Status: DC

## 2012-11-24 MED ORDER — DOXYCYCLINE HYCLATE 50 MG PO CAPS: 100.0000 mg | ORAL_CAPSULE | Freq: Two times a day (BID) | ORAL | Status: DC

## 2012-11-24 NOTE — Progress Notes (Signed)
Pt discharged to home per MD order. Pt received and reviewed all discharge instructions and medication information including follow-up appointments and prescriptions. Pt also received stroke education at discharge. Pt verbalized understanding. Pt alert and oriented at discharge with no complaints of pain. Pt escorted to private vehicle via wheelchair by guest services volunteer. Efraim Kaufmann

## 2012-11-24 NOTE — Discharge Summary (Signed)
Physician Discharge Summary  [facility] NWG:[id number] DOB: [birth year] DOA: 11/17/2012  PCP: No PCP Per Patient  Admit date: 11/17/2012 Discharge date: 11/24/2012  Time spent: 35 minutes  Recommendations for Outpatient Follow-up:  1. Follow up with cardiology, PCP, and neuro as recommended.   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   Diet recommendation: low sodium diet  Filed Weights   11/20/12 1056 11/23/12 0400 11/24/12 0400  Weight: 95 kg (209 lb 7 oz) 97.523 kg (215 lb) 97.523 kg (215 lb)    Brief narrative:  47 yo healthy male comes in with headache, transient left facial droop and dysarthria.  He exercises regularly and overall healthy, but htn and strokes run in his family. He initially had sbp 230 on arrival with high diastolic also, given hydralazine in ED with improvement in his bp and also improvement in his headache. Neuro recommended LP initially to r/o [facility] but pt refused that. He has no prior cardiac or cva history.  He was started on Cardene drip on admission to SDU then weaned off this and transferred to [facility] Floor on 7/6  Workup revealed R posterotemporal infarct on MRI, but then early am on 7/6 had mental status changes, decreased responsiveness and inability to follow commands associated with BP in 200s range again and Neurology felt this was PRES since these mental status changes couldn't be explained by his CVA alone.  Transferred to SDU again and restarted on Nicardipine gtt for tight BP control per Neuro recs . His pressure improved and he was transferred to telemetry.  Assessment/Plan:  Atypical PRES syndrome/HTN urgency  -mentation improved and at baseline now.  -BP improved so Nicardipine gtt weaned to off  -cont  HCTZ, increase Hydralazine dose and Lisinopril frequency  -Has been strongly advised to continue medications-he plans on establishing with a PCP and endorses will not have any problems obtaining prescription meds  -Echo revealed grade 1 diastolic dysfunction with a severely dilated left atrium  -Renal arterial duplex no evidence of RAS but there was an abnormal intrarenal resistive index on left and an anhypoechoic area in the mid pole of the leftrenal cortex of unknown etiology -  Follow up outpatient. Severe LVH and Grade 1 diastolic dysfunction  -new finding this admit - due to uncontrolled HTN  -stable and compensated but family requests cardiology consult/[facility]  - cardiology consulted recommended changes in medical therapy.  CVA: 5mm R posterotemporal infarct on MRI  -MRA noted  -ASA 81mg  daily  -ECHO as noted above  -LDL 77, Hbaic 5.6  -carotid duplex < 39% bilateral stenosis  -has been followed by Stroke team  Follow up in 2 months.  Hypokalemia  -secondary to HCTZ  -repleted.  Superficial thrombophlebitis: - a course of doxycycline.  Consultants:  Neurology  [facility]  Procedures:  2D ECHO  - Left ventricle: The cavity size was normal. Wall thickness was increased in a pattern of moderate to severe LVH. Systolic function was vigorous. The estimated ejection fraction was in the range of 65% to 70%. Wall motion was normal; there were no regional wall motion abnormalities. Doppler parameters are consistent with abnormal left ventricular relaxation (grade 1 diastolic dysfunction). - Aortic valve: Mild regurgitation. - Left atrium: The atrium was severely dilated.  Carotid Duplex  The vertebral arteries appear patent with antegrade flow. - Findings consistent with less than 39 percent stenosis involving the right internal carotid artery and the left internal carotid artery.  Renal Artery Duplex  - RAR is 0.59 on the right and 1.12 on the left. - No evidence of renal artery  stenosis noted bilaterally. - Normal intrarenal resistive index on right. - Abnormal intrarenal resistive index on left.There is anhypoechoic area in the mid pole of the leftrenal cortex of unknown etiology.   Discharge Exam: Filed Vitals:   11/23/12 2201 11/24/12 0400 11/24/12 0522 11/24/12 1009  BP: 173/92 151/97 148/89 162/96  Pulse:  72  75  Temp:  98.6 F (37 C)    TempSrc:  Oral    Resp:      Height:      Weight:  97.523 kg (215 lb)    SpO2:  97%      Exam:  General: alert, awake, oriented x3, mentation significantly improved  Lungs: Clear to auscultation bilaterally without wheezes or crackles  Cardiovascular: Regular rate and rhythm without murmur gallop or rub normal S1 and S2  Abdomen: Nontender, nondistended, soft, bowel sounds positive, no rebound, no ascites, no appreciable mass  Extremities: No significant cyanosis, clubbing, or edema bilateral lower extremities  Neuro: Alert and oriented x3, moves all extremities x4, cranial nerves II through XII appear grossly intact   Discharge Instructions  Discharge Orders   Future Appointments Provider Department Dept Phone   12/12/2012 1:40 PM Abelino Derrick, PA-C [facility] [place] [phone]   Future Orders Complete By Expires     Diet - low sodium heart healthy  As directed     Discharge instructions  As directed     Comments:      Follow up with PCP in one week, please obtain a BMP in one week. Follow up with cardiology in one week,. Please do not engage in strenous activity/ exercise till you are seen and cleared by cardiology as outpatient.  Please follow up with neurology as recommended.        Medication List    STOP taking these medications       ibuprofen 200 MG tablet  Commonly known as:  ADVIL,MOTRIN     naproxen sodium 220 MG tablet  Commonly known as:  ANAPROX      TAKE these medications       amLODipine 5 MG tablet  Commonly known as:  NORVASC  Take 1  tablet (5 mg total) by mouth daily.     aspirin 81 MG EC tablet  Take 1 tablet (81 mg total) by mouth daily.     divalproex 500 MG 24 hr tablet  Commonly known as:  DEPAKOTE ER  Take 1 tablet (500 mg total) by mouth daily.     hydrALAZINE 100 MG tablet  Commonly known as:  APRESOLINE  Take 1 tablet (100 mg total) by mouth every 8 (eight) hours.     hydrochlorothiazide 25 MG tablet  Commonly known as:  HYDRODIURIL  Take 1 tablet (25 mg total) by mouth daily.     lisinopril 20 MG tablet  Commonly known as:  PRINIVIL,ZESTRIL  Take 1 tablet (20 mg total) by mouth 2 (two) times daily.     metoprolol succinate 50 MG 24 hr tablet  Commonly known as:  TOPROL-XL  Take 1 tablet (50 mg total) by mouth daily. Take with or immediately following a meal.       No  Known Allergies     Follow-up Information   Follow up with KILROY,LUKE K, PA-C. (office will call you)    Contact information:   [address] [place] [address] [phone]        The results of significant diagnostics from this hospitalization (including imaging, microbiology, ancillary and laboratory) are listed below for reference.    Significant Diagnostic Studies: Ct Head Wo Contrast  11/19/2012   *RADIOLOGY REPORT*  Clinical Data: New neurological changes.  The patient is not following commands.  CT HEAD WITHOUT CONTRAST  Technique:  Contiguous axial images were obtained from the base of the skull through the vertex without contrast.  Comparison: CT head 11/17/2012.  MRI brain 11/18/2012.  Findings: Subtle low attenuation change in the deep left frontal white matter is again demonstrated without change.  This may represent chronic small vessel ischemic changes.  This corresponds to white matter changes on previous MRI. Ventricles and sulci appear otherwise symmetrical.  No mass effect or midline shift.  No abnormal extra-axial fluid collections.  Gray-white matter junctions are distinct.  Basal cisterns  are not effaced.  No ventricular dilatation.  No evidence of acute intracranial hemorrhage.  No depressed skull fractures.  Visualized paranasal sinuses and mastoid air cells are not opacified.  Stable appearance since previous study.  IMPRESSION: No acute intracranial abnormalities demonstrated.   Original Report Authenticated By: Burman Nieves, M.D.   Ct Head Wo Contrast  11/17/2012   *RADIOLOGY REPORT*  Clinical Data: Severe headaches.  CT HEAD WITHOUT CONTRAST  Technique:  Contiguous axial images were obtained from the base of the skull through the vertex without contrast.  Comparison: None.  Findings: No evidence for acute hemorrhage, mass lesion, midline shift, hydrocephalus or large infarct.  Subtle low density in the left frontal white matter may represent chronic changes.  In general, the white matter is very prominent and this may be related to technical factors.  Normal appearance of the ventricles and ventricles.  Visualized sinuses are clear.  No acute bony abnormality.  IMPRESSION: No acute intracranial abnormality.  Subtle low density in the left frontal white matter.  Findings could represent chronic changes.   Original Report Authenticated By: Richarda Overlie, M.D.   Mr [facility] Wo Contrast  11/18/2012   *RADIOLOGY REPORT*  Clinical Data:  Headache associated with hypertensive urgency. Transient episode of slurred speech and facial drooping which reportedly resolved.  Currently blood pressure improvement with normal neurologic exam.  MRI HEAD WITHOUT AND WITH CONTRAST MRA HEAD WITHOUT CONTRAST  Technique:  Multiplanar, multiecho pulse sequences of the brain and surrounding structures were obtained without and with intravenous contrast.  Angiographic images of the head were obtained using MRA technique without contrast.  Contrast: 17mL MULTIHANCE GADOBENATE DIMEGLUMINE 529 MG/ML IV SOLN  Comparison:  CT head 11/17/2012.  MRI HEAD WITHOUT AND WITH CONTRAST  Findings:  5 mm area of acute infarction  affects the right posterior temporal subcortical white matter (image 16 series 4.). No other areas of acute infarction.  No acute or chronic hemorrhage, mass lesion, hydrocephalus, or extra-axial fluid. Normal cerebral volume.  Premature subcortical greater than periventricular white matter signal representing chronic microvascular ischemic change.  Unusually prominent asymmetric focus of subcortical white matter signal abnormality, more confluent than chronic microvascular ischemic change, suspected vasogenic edema  in the left frontal region (images 15 - 16 series 7) may represent an atypical manifestation of PRES.  No classic areas of vasogenic edema are seen in the parieto-occipital or  cerebellar regions.  Attention to this area on followup scanning, possibly a reversible abnormality. No large vessel infarct.  Flow voids are maintained in the major intracranial vessels which are moderately dolichoectatic.  Normal pituitary and cerebellar tonsils.  Upper cervical region unremarkable.  No osseous lesions. No acute sinus or mastoid disease.  Post infusion, there is no abnormal enhancement of the brain or meninges.  IMPRESSION: 5 mm area of acute infarction affecting the right posterior temporal subcortical white matter.  Possible atypical manifestation of PRES with suspected left frontal subcortical vasogenic edema without restricted or facilitated diffusion.  Premature chronic microvascular ischemic change in the periventricular and subcortical white matter for the patient's age of 8, likely hypertensive related.  MRA HEAD  Findings: Dolichoectatic but widely patent internal carotid arteries.  Dolichoectatic basilar artery with mid basilar irregularity but no flow limiting stenosis.  Both vertebrals are dolichoectatic but appear to contribute equally to the basilar.  Fetal origin left PCA.  No proximal anterior or middle cerebral artery stenosis. No PCA stenosis or occlusion.  No cerebellar branch occlusion.  No  visible intracranial aneurysm.  IMPRESSION: Dolichoectatic but otherwise unremarkable intracranial circulation. Mild mid basilar irregularity without flow limiting stenosis.   Original Report Authenticated By: Davonna Belling, M.D.   Mr [facility] Wo Contrast  11/18/2012   *RADIOLOGY REPORT*  Clinical Data:  Headache associated with hypertensive urgency. Transient episode of slurred speech and facial drooping which reportedly resolved.  Currently blood pressure improvement with normal neurologic exam.  MRI HEAD WITHOUT AND WITH CONTRAST MRA HEAD WITHOUT CONTRAST  Technique:  Multiplanar, multiecho pulse sequences of the brain and surrounding structures were obtained without and with intravenous contrast.  Angiographic images of the head were obtained using MRA technique without contrast.  Contrast: 17mL MULTIHANCE GADOBENATE DIMEGLUMINE 529 MG/ML IV SOLN  Comparison:  CT head 11/17/2012.  MRI HEAD WITHOUT AND WITH CONTRAST  Findings:  5 mm area of acute infarction affects the right posterior temporal subcortical white matter (image 16 series 4.). No other areas of acute infarction.  No acute or chronic hemorrhage, mass lesion, hydrocephalus, or extra-axial fluid. Normal cerebral volume.  Premature subcortical greater than periventricular white matter signal representing chronic microvascular ischemic change.  Unusually prominent asymmetric focus of subcortical white matter signal abnormality, more confluent than chronic microvascular ischemic change, suspected vasogenic edema  in the left frontal region (images 15 - 16 series 7) may represent an atypical manifestation of PRES.  No classic areas of vasogenic edema are seen in the parieto-occipital or cerebellar regions.  Attention to this area on followup scanning, possibly a reversible abnormality. No large vessel infarct.  Flow voids are maintained in the major intracranial vessels which are moderately dolichoectatic.  Normal pituitary and cerebellar tonsils.  Upper  cervical region unremarkable.  No osseous lesions. No acute sinus or mastoid disease.  Post infusion, there is no abnormal enhancement of the brain or meninges.  IMPRESSION: 5 mm area of acute infarction affecting the right posterior temporal subcortical white matter.  Possible atypical manifestation of PRES with suspected left frontal subcortical vasogenic edema without restricted or facilitated diffusion.  Premature chronic microvascular ischemic change in the periventricular and subcortical white matter for the patient's age of 80, likely hypertensive related.  MRA HEAD  Findings: Dolichoectatic but widely patent internal carotid arteries.  Dolichoectatic basilar artery with mid basilar irregularity but no flow limiting stenosis.  Both vertebrals are dolichoectatic but appear to contribute equally to the basilar.  Fetal origin left PCA.  No proximal anterior or middle cerebral artery stenosis. No PCA stenosis or occlusion.  No cerebellar branch occlusion.  No visible intracranial aneurysm.  IMPRESSION: Dolichoectatic but otherwise unremarkable intracranial circulation. Mild mid basilar irregularity without flow limiting stenosis.   Original Report Authenticated By: Davonna Belling, M.D.   US Renal  11/22/2012   *RADIOLOGY REPORT*  Clinical Data: Abnormal parenchyma on renal Doppler exam, history acute renal failure  RENAL/URINARY TRACT ULTRASOUND COMPLETE  Comparison:  None  Findings:  Right Kidney:  12.1 cm length. Normal cortical thickness. Minimally increased cortical echogenicity.  No mass, hydronephrosis or perinephric fluid.  6 mm echogenic focus mid right kidney without definite shadowing question nonobstructing calculus.  Left Kidney:  11.4 cm length.  Normal cortical thickness.  Question slightly increased cortical echogenicity.  No mass, hydronephrosis or shadowing calcification.  No perinephric fluid.  Bladder:  Well-distended, unremarkable.  IMPRESSION: Question 6 mm nonobstructing calculus right  kidney. Slightly increased renal cortical echogenicity bilaterally suggesting medical renal disease.   Original Report Authenticated By: Ulyses Southward, M.D.    Microbiology: Recent Results (from the past 240 hour(s))  MRSA PCR SCREENING     Status: None   Collection Time    11/17/12 11:47 PM      Result Value Range Status   MRSA by PCR NEGATIVE  NEGATIVE Final   Comment:            The GeneXpert MRSA Assay (FDA     approved for NASAL specimens     only), is one component of a     comprehensive MRSA colonization     surveillance program. It is not     intended to diagnose MRSA     infection nor to guide or     monitor treatment for     MRSA infections.     Labs: Basic Metabolic Panel:  Recent Labs Lab 11/19/12 0538 11/20/12 0535 11/21/12 0510 11/22/12 0448 11/23/12 0505  NA 136 136 135 137 139  K 3.3* 3.2* 2.9* 3.9 3.9  CL 99 98 98 104 102  CO2 28 23 26 25 23   GLUCOSE 103* 107* 111* 97 98  BUN 16 14 14 13 16   CREATININE 1.11 0.92 0.97 0.91 1.07  CALCIUM 9.0 9.3 9.2 8.9 9.4  MG  --  1.8  --   --   --    Liver Function Tests: No results found for this basename: AST, ALT, ALKPHOS, BILITOT, PROT, ALBUMIN,  in the last 168 hours No results found for this basename: LIPASE, AMYLASE,  in the last 168 hours No results found for this basename: AMMONIA,  in the last 168 hours CBC:  Recent Labs Lab 11/17/12 1820 11/18/12 0605 11/19/12 2223  WBC 9.7 12.8* 12.4*  NEUTROABS 7.0  --  8.5*  HGB 14.8 15.0 15.0  HCT 40.7 41.9 41.8  MCV 83.4 83.3 83.9  PLT 183 184 192   Cardiac Enzymes:  Recent Labs Lab 11/18/12 0008 11/18/12 0605 11/18/12 1114  TROPONINI <0.30 <0.30 <0.30   BNP: BNP (last 3 results) No results found for this basename: PROBNP,  in the last 8760 hours CBG: No results found for this basename: GLUCAP,  in the last 168 hours     Signed:  Jakaila Norment  [facility] 11/24/2012, 11:15 AM

## 2012-11-24 NOTE — Progress Notes (Signed)
Pt R anterior forearm red, tender and swollen along vein path at peripheral IV site on assessment, compatible with possible phlebitis.  IV removed per protocol. MD notified to assess. Will continue to monitor.  Efraim Kaufmann

## 2012-11-24 NOTE — Plan of Care (Signed)
Problem: Phase I Progression Outcomes Goal: NIH Stroke Scale-NIHSS done on admission Outcome: Not Met (add Reason) NIH not documented on admission in Epic.  Pt transferred to this unit.  NIH documented at discharge. Efraim Kaufmann

## 2012-11-24 NOTE — Progress Notes (Signed)
Subjective:  No apparent residual effects of his CVA  Objective:  Vital Signs in the last 24 hours: Temp:  [97.6 F (36.4 C)-98.6 F (37 C)] 98.6 F (37 C) (07/11 0400) Pulse Rate:  [64-75] 75 (07/11 1009) BP: (142-173)/(89-97) 162/96 mmHg (07/11 1009) SpO2:  [94 %-97 %] 97 % (07/11 0400) Weight:  [215 lb (97.523 kg)] 215 lb (97.523 kg) (07/11 0400)  Intake/Output from previous day:  Intake/Output Summary (Last 24 hours) at 11/24/12 1047 Last data filed at 11/23/12 2201  Gross per 24 hour  Intake      3 ml  Output      0 ml  Net      3 ml    Physical Exam: General appearance: alert, cooperative and no distress Lungs: clear to auscultation bilaterally Heart: regular rate and rhythm Neurologic: Grossly normal   Rate: 74  Rhythm: normal sinus rhythm  Lab Results: No results found for this basename: WBC, HGB, PLT,  in the last 72 hours  Recent Labs  11/22/12 0448 11/23/12 0505  NA 137 139  K 3.9 3.9  CL 104 102  CO2 25 23  GLUCOSE 97 98  BUN 13 16  CREATININE 0.91 1.07     Imaging: Imaging results have been reviewed No CXR done this admission  Cardiac Studies:  Assessment/Plan:   Principal Problem:   Hypertensive urgency Active Problems:   Hypertension   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 He will need a BMP in follow up.  Corine Shelter PA-C [facility] [phone] 11/24/2012, 10:47 AM   Agree with note written by Corine Shelter [facility]  Admitted with [facility] urgency. Was on no antihypertensive meds PTA. Had CVA secondary to HTN w/o residua. On multiple meds now. Exam benign. BP better controlled. Will need close OP follow up (TCM7). Will get renal dopplers as OP to R/O RAS.   Runell Gess 11/24/2012 11:04 AM

## 2012-12-12 ENCOUNTER — Ambulatory Visit: Payer: Self-pay | Admitting: Physician Assistant

## 2012-12-12 ENCOUNTER — Ambulatory Visit: Payer: Self-pay | Admitting: Cardiology

## 2012-12-12 ENCOUNTER — Encounter: Payer: Self-pay | Admitting: Physician Assistant

## 2012-12-12 VITALS — BP 146/100 | HR 80 | Ht 70.0 in | Wt 208.5 lb

## 2012-12-12 DIAGNOSIS — I10 Essential (primary) hypertension: Secondary | ICD-10-CM

## 2012-12-12 MED ORDER — AMLODIPINE BESYLATE 10 MG PO TABS: 10.0000 mg | ORAL_TABLET | Freq: Every day | ORAL | Status: DC

## 2012-12-12 MED ORDER — AMLODIPINE BESYLATE 5 MG PO TABS: 10.0000 mg | ORAL_TABLET | Freq: Every day | ORAL | Status: DC

## 2012-12-12 NOTE — Progress Notes (Signed)
Date:  12/12/2012   ID:  Jeanette Santana, DOB [birth year], MRN [id number]  PCP:  Katy Apo, MD  Primary Cardiologist:       History of Present Illness: Cameron Eaton is a 47 y.o. male with no previous PMH , who was admitted on 11/17/12 for hypertensive urgency with SBP in the 200s. In the ED, he was noted to have slurred speech and left sided facial droop. Work up revealed right posterotemporal infarct on MRI, but then early am on 7/6, he had mental status changes, decreased responsiveness and inability to follow commands associated with BP in the 200 range again and Neurology felt this was PRES since these mental status changes couldn't be explained by his CVA alone. He has been followed by Neurology and [facility]. An echo was obtained that revealed severe LVH.  He does not have an established PCP. He was not on any medications prior to this admission. He does not smoke. He has a family history of CAD. His brother had an MI at the age of 74, necessitating placement of 2 stents.   As presented today for followup. He reports more improvement in the last couple days and feels that he is essentially back to baseline.  His blood pressure in the mornings is ranging in the 140s systolic and typically in the afternoon it is down around 118. 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 He also divides his time between here and [facility] for work.Marland Kitchen  He currently denies nausea, vomiting, fever, chest pain, shortness of breath, orthopnea, dizziness, PND, cough, congestion, abdominal pain, hematochezia, melena, lower extremity edema.  Wt Readings from Last 3 Encounters:  12/12/12 208 lb 8 oz (94.575 kg)  11/24/12 215 lb (97.523 kg)     Past Medical History  Diagnosis Date  . Hypertension   . CVA (cerebral infarction) 11/18/12    Acute Rt brain by MRI    Current Outpatient Prescriptions  Medication Sig Dispense Refill  . amLODipine  (NORVASC) 10 MG tablet Take 1 tablet (10 mg total) by mouth daily.  30 tablet  6  . aspirin EC 81 MG EC tablet Take 1 tablet (81 mg total) by mouth daily.  30 tablet  1  . divalproex (DEPAKOTE ER) 500 MG 24 hr tablet Take 1 tablet (500 mg total) by mouth daily.  30 tablet  1  . hydrALAZINE (APRESOLINE) 100 MG tablet Take 1 tablet (100 mg total) by mouth every 8 (eight) hours.  90 tablet  1  . hydrochlorothiazide (HYDRODIURIL) 25 MG tablet Take 1 tablet (25 mg total) by mouth daily.  30 tablet  1  . lisinopril (PRINIVIL,ZESTRIL) 20 MG tablet Take 1 tablet (20 mg total) by mouth 2 (two) times daily.  60 tablet  1  . metoprolol succinate (TOPROL-XL) 50 MG 24 hr tablet Take 1 tablet (50 mg total) by mouth daily. Take with or immediately following a meal.  30 tablet  1   No current facility-administered medications for this visit.    Allergies:   No Known Allergies  Social History:  The patient  reports that he has never smoked. He has never used smokeless tobacco. He reports that  drinks alcohol. He reports that he does not use illicit drugs.   Family history:   Family History  Problem Relation Age of Onset  . Coronary artery disease Brother 62    MI treated with PCI x 2  .  CVA Father     ROS:  Please see the history of present illness.  All other systems reviewed and negative.   PHYSICAL EXAM: VS:  BP 146/100  Pulse 80  Ht 5\' 10"  (1.778 m)  Wt 208 lb 8 oz (94.575 kg)  BMI 29.92 kg/m2 Well nourished, well developed, in no acute distress HEENT: Pupils are equal round react to light accommodation extraocular movements are intact.  Neck: no JVDNo cervical lymphadenopathy. Cardiac: Regular rate and rhythm with 2/6 systolic murmur at the right sternal border Lungs:  clear to auscultation bilaterally, no wheezing, rhonchi or rales Ext: no lower extremity edema.  2+ radial and dorsalis pedis pulses. Skin: warm and dry Neuro:  Grossly normal  ASSESSMENT AND PLAN:  Problem List Items  Addressed This Visit   Hypertension (Chronic)     Blood pressure has improved. He still not as controlled as I'd like him to be. Will go him increase his amlodipine to 10 mg daily. He started maxed out on lisinopril and hydralazine.  There is room on the metoprolol for further titration if needed. Also be scheduled for renal artery Dopplers to rule out stenosis.  Discussed slowly progressing and increasing daily activity and also indicated that he needs time to heal overall.  We also discussed the decreasing sodium in his diet. 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    Relevant Medications      amLODIpine (NORVASC) tablet    Other Visit Diagnoses   HTN (hypertension)    -  Primary    Relevant Medications       amLODIpine (NORVASC) tablet    Other Relevant Orders       US Renal Artery Stenosis

## 2012-12-12 NOTE — Patient Instructions (Addendum)
Follow up with Dr. Tresa Endo in 3 months.   You will be scheduled for renal artery ultra sound.   We are increasing your dose of Amlodipine to 10 mg daily. Increase activity level slowly.   Limit sodium to < 2000mg  daily

## 2012-12-12 NOTE — Assessment & Plan Note (Signed)
Blood pressure has improved. He still not as controlled as I'd like him to be. Will go him increase his amlodipine to 10 mg daily. He started maxed out on lisinopril and hydralazine.  There is room on the metoprolol for further titration if needed. Also be scheduled for renal artery Dopplers to rule out stenosis.  Discussed slowly progressing and increasing daily activity and also indicated that he needs time to heal overall.  We also discussed the decreasing sodium in his diet. 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

## 2012-12-13 ENCOUNTER — Telehealth: Payer: Self-pay | Admitting: Neurology

## 2012-12-13 NOTE — Telephone Encounter (Signed)
Patient wants a med change. Does not like Depakote, making patient depressed.

## 2012-12-14 NOTE — Telephone Encounter (Signed)
depakote is a mood elevator and used to treat depression. Try increasing dose

## 2012-12-15 NOTE — Telephone Encounter (Signed)
Initial message was misinterpreted: patient did not mean "depressed" he meant sedated or lethargic.  Per Dr Pearlean Brownie, the pt has been informed that he may discontinue his Depakote Rx.  The pt understood and had no further questions.

## 2012-12-28 ENCOUNTER — Encounter (HOSPITAL_COMMUNITY): Payer: Self-pay

## 2013-01-16 ENCOUNTER — Encounter (HOSPITAL_COMMUNITY): Payer: Self-pay

## 2013-02-28 ENCOUNTER — Encounter: Payer: Self-pay | Admitting: Neurology

## 2013-02-28 ENCOUNTER — Encounter (INDEPENDENT_AMBULATORY_CARE_PROVIDER_SITE_OTHER): Payer: Self-pay

## 2013-02-28 ENCOUNTER — Ambulatory Visit (INDEPENDENT_AMBULATORY_CARE_PROVIDER_SITE_OTHER): Payer: BC Managed Care – PPO | Admitting: Neurology

## 2013-02-28 VITALS — BP 139/87 | HR 79 | Temp 97.9°F | Ht 72.0 in | Wt 209.0 lb

## 2013-02-28 DIAGNOSIS — I6783 Posterior reversible encephalopathy syndrome: Secondary | ICD-10-CM | POA: Insufficient documentation

## 2013-02-28 DIAGNOSIS — G9349 Other encephalopathy: Secondary | ICD-10-CM

## 2013-02-28 NOTE — Patient Instructions (Signed)
Continue strict control of hypertension with blood pressure goal below 130/90. Continue aspirin for stroke prevention. Repeat MRI scan of the brain with and without contrast to look for resolution of previous changes of PRES. Return for followup in 3 months with Larita Fife, NP. call earlier if necessary

## 2013-02-28 NOTE — Progress Notes (Signed)
[facility] [address] [place]. [place] [address] [phone]       OFFICE FOLLOW-UP NOTE  [name] Date of Birth:  July 20, 1965 Medical Record Number:  [id number]   HPI: Cameron Eaton is a 47 year old male who was seen for first office followup visit for hospital consultation on 11/17/12. He presented with headache,altered mental status as well as transient left facial droop and dysarthria in the setting of significantly elevated blood pressure which was 230 systolic on arrival. He required treatment with IV hydralazine and nicardipine drip as well.. Even had a spinal tap which ruled out subarachnoid hemorrhage or meningitis. He was admitted to the to the intensive care unit and required IV nicardipine drip which was slowly tapered off. MRI scan of the brain showed a tiny 5 mm right medial temporal subcortical diffusion positive infarct which was felt to be asymptomatic and not explain his altered mental status which improved after blood pressure control. He was started on hydralazine, hydrochlorothiazide and lisinopril for blood pressure control. Renal artery Doppler showed no evidence of renal artery stenosis. Transthoracic echo showed severe left ventricular hypertrophy with grade 1 systolic dysfunction. Carotid Doppler showed less than 39% bilateral stenosis. Patient states his general sensation and she's had no slurred speech her recurrent neurological symptoms. He does feel tired more easily and does not have the same degree of stamina. His blood pressure is much better control and is 135/87 in the office today. His headaches have also resolved completely.  ROS:   14 system review of systems is positive for fatigue and tiredness only  PMH:  Past Medical History  Diagnosis Date  . Hypertension   . CVA (cerebral infarction) 11/18/12    Acute Rt brain by MRI    Social History:  History   Social History  . Marital Status: Divorced    Spouse Name: N/A   Number of Children: 2  . Years of Education: College   Occupational History  .     Social History Main Topics  . Smoking status: Never Smoker   . Smokeless tobacco: Never Used  . Alcohol Use: Yes     Comment: social  . Drug Use: No  . Sexual Activity: Yes   Other Topics Concern  . Not on file   Social History Narrative  . No narrative on file    Medications:   Current Outpatient Prescriptions on File Prior to Visit  Medication Sig Dispense Refill  . amLODipine (NORVASC) 10 MG tablet Take 1 tablet (10 mg total) by mouth daily.  30 tablet  6  . aspirin EC 81 MG EC tablet Take 1 tablet (81 mg total) by mouth daily.  30 tablet  1  . hydrALAZINE (APRESOLINE) 100 MG tablet Take 1 tablet (100 mg total) by mouth every 8 (eight) hours.  90 tablet  1  . hydrochlorothiazide (HYDRODIURIL) 25 MG tablet Take 1 tablet (25 mg total) by mouth daily.  30 tablet  1  . lisinopril (PRINIVIL,ZESTRIL) 20 MG tablet Take 1 tablet (20 mg total) by mouth 2 (two) times daily.  60 tablet  1   No current facility-administered medications on file prior to visit.    Allergies:   Allergies  Allergen Reactions  . Percocet [Oxycodone-Acetaminophen]     Physical Exam General: well developed, well nourished, seated, in no evident distress Head: head normocephalic and atraumatic. Orohparynx benign Neck: supple with no carotid or supraclavicular bruits Cardiovascular: regular rate and rhythm, no murmurs Musculoskeletal: no deformity Skin:  no rash/petichiae Vascular:  Normal pulses all extremities Filed Vitals:   02/28/13 1424  BP: 139/87  Pulse: 79  Temp: 97.9 F (36.6 C)    Neurologic Exam Mental Status: Awake and fully alert. Oriented to place and time. Recent and remote memory intact. Attention span, concentration and fund of knowledge appropriate. Mood and affect appropriate.  Cranial Nerves: Fundoscopic exam reveals sharp disc margins. Pupils equal, briskly reactive to light. Extraocular  movements full without nystagmus. Visual fields full to confrontation. Hearing intact. Facial sensation intact. Face, tongue, palate moves normally and symmetrically.  Motor: Normal bulk and tone. Normal strength in all tested extremity muscles. Sensory.: intact to touch and pinprick and vibratory sensation.  Coordination: Rapid alternating movements normal in all extremities. Finger-to-nose and heel-to-shin performed accurately bilaterally. Gait and Station: Arises from chair without difficulty. Stance is normal. Gait demonstrates normal stride length and balance . Able to heel, toe and tandem walk without difficulty.  Reflexes: 1+ and symmetric. Toes downgoing.   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 MRI scan of the brain at that time showed a silent tiny 5 mm right temporal subcortical infarct felt to be asymptomatic.    PLAN: Continue strict control of hypertension with blood pressure goal below 130/90. Continue aspirin for stroke prevention. Repeat MRI scan of the brain with and without contrast to look for resolution of previous changes of PRES. Return for followup in 3 months with Cameron Fife, NP. call earlier if necessary

## 2013-04-25 ENCOUNTER — Other Ambulatory Visit: Payer: BC Managed Care – PPO

## 2013-05-23 ENCOUNTER — Other Ambulatory Visit: Payer: Self-pay | Admitting: Neurology

## 2013-05-23 ENCOUNTER — Ambulatory Visit (INDEPENDENT_AMBULATORY_CARE_PROVIDER_SITE_OTHER): Payer: BC Managed Care – PPO

## 2013-05-23 DIAGNOSIS — I6783 Posterior reversible encephalopathy syndrome: Secondary | ICD-10-CM

## 2013-05-23 DIAGNOSIS — G9349 Other encephalopathy: Secondary | ICD-10-CM

## 2013-05-23 MED ORDER — GADOPENTETATE DIMEGLUMINE 469.01 MG/ML IV SOLN: 12.0000 mL | Freq: Once | INTRAVENOUS | Status: AC | PRN

## 2013-05-25 ENCOUNTER — Other Ambulatory Visit: Payer: Self-pay | Admitting: Neurology

## 2013-05-25 DIAGNOSIS — R9409 Abnormal results of other function studies of central nervous system: Secondary | ICD-10-CM

## 2013-05-25 DIAGNOSIS — G35 Multiple sclerosis: Secondary | ICD-10-CM

## 2013-06-05 ENCOUNTER — Encounter (INDEPENDENT_AMBULATORY_CARE_PROVIDER_SITE_OTHER): Payer: BC Managed Care – PPO

## 2013-06-05 ENCOUNTER — Encounter (INDEPENDENT_AMBULATORY_CARE_PROVIDER_SITE_OTHER): Payer: Self-pay

## 2013-06-05 DIAGNOSIS — R9409 Abnormal results of other function studies of central nervous system: Secondary | ICD-10-CM

## 2013-06-05 DIAGNOSIS — G35 Multiple sclerosis: Secondary | ICD-10-CM

## 2013-06-11 ENCOUNTER — Telehealth: Payer: Self-pay | Admitting: Nurse Practitioner

## 2013-06-11 ENCOUNTER — Ambulatory Visit: Payer: BC Managed Care – PPO | Admitting: Nurse Practitioner

## 2013-06-11 NOTE — Telephone Encounter (Signed)
Patient was no show for office appointment today. 

## 2013-08-24 ENCOUNTER — Other Ambulatory Visit: Payer: Self-pay | Admitting: Physician Assistant

## 2013-08-27 NOTE — Telephone Encounter (Signed)
Rx was sent to pharmacy electronically. 

## 2013-12-23 ENCOUNTER — Other Ambulatory Visit: Payer: Self-pay | Admitting: Cardiovascular Disease

## 2013-12-24 NOTE — Telephone Encounter (Signed)
Rx refill sent to patient pharmacy   

## 2014-02-14 ENCOUNTER — Other Ambulatory Visit: Payer: Self-pay | Admitting: Cardiovascular Disease

## 2014-02-15 NOTE — Telephone Encounter (Signed)
Rx was sent to pharmacy electronically. 

## 2014-06-17 ENCOUNTER — Other Ambulatory Visit: Payer: Self-pay | Admitting: Cardiovascular Disease

## 2014-06-17 NOTE — Telephone Encounter (Signed)
Rx refill sent to patient pharmacy, patient's last OV was July 2014, note sent with RX to make appointment. DO NOT REFILL Rx anymore unless appt is made

## 2014-08-05 ENCOUNTER — Other Ambulatory Visit: Payer: Self-pay | Admitting: Physician Assistant

## 2014-08-06 NOTE — Telephone Encounter (Signed)
Rx refill denied to patient pharmacy  Last OV 11/2012

## 2014-08-19 ENCOUNTER — Other Ambulatory Visit (HOSPITAL_COMMUNITY): Payer: Self-pay | Admitting: Internal Medicine

## 2014-08-19 ENCOUNTER — Other Ambulatory Visit (HOSPITAL_COMMUNITY): Payer: Self-pay

## 2014-08-19 DIAGNOSIS — I359 Nonrheumatic aortic valve disorder, unspecified: Secondary | ICD-10-CM

## 2014-09-18 ENCOUNTER — Ambulatory Visit (HOSPITAL_COMMUNITY): Payer: 59 | Attending: Cardiovascular Disease

## 2014-09-18 ENCOUNTER — Other Ambulatory Visit (HOSPITAL_COMMUNITY): Payer: 59

## 2014-09-18 ENCOUNTER — Other Ambulatory Visit: Payer: Self-pay

## 2014-09-18 DIAGNOSIS — I10 Essential (primary) hypertension: Secondary | ICD-10-CM | POA: Diagnosis not present

## 2014-09-18 DIAGNOSIS — I359 Nonrheumatic aortic valve disorder, unspecified: Secondary | ICD-10-CM | POA: Diagnosis not present

## 2014-09-19 ENCOUNTER — Other Ambulatory Visit: Payer: Self-pay | Admitting: Physician Assistant

## 2014-09-19 NOTE — Telephone Encounter (Signed)
Rx(s) sent to pharmacy electronically.  

## 2014-10-03 ENCOUNTER — Emergency Department (HOSPITAL_COMMUNITY)
Admission: EM | Admit: 2014-10-03 | Discharge: 2014-10-03 | Disposition: A | Discharge status: Home / Self Care | Payer: 59 | Attending: Emergency Medicine | Admitting: Emergency Medicine

## 2014-10-03 ENCOUNTER — Encounter (HOSPITAL_COMMUNITY): Payer: Self-pay | Admitting: *Deleted

## 2014-10-03 ENCOUNTER — Emergency Department (HOSPITAL_COMMUNITY): Payer: 59

## 2014-10-03 DIAGNOSIS — Y998 Other external cause status: Secondary | ICD-10-CM | POA: Diagnosis not present

## 2014-10-03 DIAGNOSIS — Z7982 Long term (current) use of aspirin: Secondary | ICD-10-CM | POA: Insufficient documentation

## 2014-10-03 DIAGNOSIS — S0033XA Contusion of nose, initial encounter: Secondary | ICD-10-CM

## 2014-10-03 DIAGNOSIS — Z23 Encounter for immunization: Secondary | ICD-10-CM | POA: Diagnosis not present

## 2014-10-03 DIAGNOSIS — S01511A Laceration without foreign body of lip, initial encounter: Secondary | ICD-10-CM | POA: Insufficient documentation

## 2014-10-03 DIAGNOSIS — Z8673 Personal history of transient ischemic attack (TIA), and cerebral infarction without residual deficits: Secondary | ICD-10-CM | POA: Insufficient documentation

## 2014-10-03 DIAGNOSIS — I10 Essential (primary) hypertension: Secondary | ICD-10-CM | POA: Diagnosis not present

## 2014-10-03 DIAGNOSIS — W01198A Fall on same level from slipping, tripping and stumbling with subsequent striking against other object, initial encounter: Secondary | ICD-10-CM | POA: Diagnosis not present

## 2014-10-03 DIAGNOSIS — Y9289 Other specified places as the place of occurrence of the external cause: Secondary | ICD-10-CM | POA: Insufficient documentation

## 2014-10-03 DIAGNOSIS — Y9301 Activity, walking, marching and hiking: Secondary | ICD-10-CM | POA: Diagnosis not present

## 2014-10-03 DIAGNOSIS — Z79899 Other long term (current) drug therapy: Secondary | ICD-10-CM | POA: Diagnosis not present

## 2014-10-03 MED ORDER — CEPHALEXIN 500 MG PO CAPS: 500.0000 mg | ORAL_CAPSULE | Freq: Four times a day (QID) | ORAL | Status: DC

## 2014-10-03 MED ORDER — LIDOCAINE HCL (PF) 1 % IJ SOLN
10.0000 mL | Freq: Once | INTRAMUSCULAR | Status: DC
  Filled 2014-10-03: qty 10

## 2014-10-03 MED ORDER — TETANUS-DIPHTH-ACELL PERTUSSIS 5-2.5-18.5 LF-MCG/0.5 IM SUSP
0.5000 mL | Freq: Once | INTRAMUSCULAR | Status: AC
  Administered 2014-10-03: 0.5 mL via INTRAMUSCULAR
  Filled 2014-10-03: qty 0.5

## 2014-10-03 MED ORDER — LIDOCAINE-EPINEPHRINE 1 %-1:100000 IJ SOLN
INTRAMUSCULAR | Status: AC
  Administered 2014-10-03: 22:00:00
  Filled 2014-10-03: qty 1

## 2014-10-03 NOTE — Discharge Instructions (Signed)
Keflex as prescribed.  Call Dr. Maude Leriche office to arrange a follow-up appointment in 10 days as recommended.   Return to the emergency department if your symptoms significantly worsen or change.   Laceration Care, Adult A laceration is a cut or lesion that goes through all layers of the skin and into the tissue just beneath the skin. TREATMENT  Some lacerations may not require closure. Some lacerations may not be able to be closed due to an increased risk of infection. It is important to see your caregiver as soon as possible after an injury to minimize the risk of infection and maximize the opportunity for successful closure. If closure is appropriate, pain medicines may be given, if needed. The wound will be cleaned to help prevent infection. Your caregiver will use stitches (sutures), staples, wound glue (adhesive), or skin adhesive strips to repair the laceration. These tools bring the skin edges together to allow for faster healing and a better cosmetic outcome. However, all wounds will heal with a scar. Once the wound has healed, scarring can be minimized by covering the wound with sunscreen during the day for 1 full year. HOME CARE INSTRUCTIONS  For sutures or staples:  Keep the wound clean and dry.  If you were given a bandage (dressing), you should change it at least once a day. Also, change the dressing if it becomes wet or dirty, or as directed by your caregiver.  Wash the wound with soap and water 2 times a day. Rinse the wound off with water to remove all soap. Pat the wound dry with a clean towel.  After cleaning, apply a thin layer of the antibiotic ointment as recommended by your caregiver. This will help prevent infection and keep the dressing from sticking.  You may shower as usual after the first 24 hours. Do not soak the wound in water until the sutures are removed.  Only take over-the-counter or prescription medicines for pain, discomfort, or fever as directed by  your caregiver.  Get your sutures or staples removed as directed by your caregiver. For skin adhesive strips:  Keep the wound clean and dry.  Do not get the skin adhesive strips wet. You may bathe carefully, using caution to keep the wound dry.  If the wound gets wet, pat it dry with a clean towel.  Skin adhesive strips will fall off on their own. You may trim the strips as the wound heals. Do not remove skin adhesive strips that are still stuck to the wound. They will fall off in time. For wound adhesive:  You may briefly wet your wound in the shower or bath. Do not soak or scrub the wound. Do not swim. Avoid periods of heavy perspiration until the skin adhesive has fallen off on its own. After showering or bathing, gently pat the wound dry with a clean towel.  Do not apply liquid medicine, cream medicine, or ointment medicine to your wound while the skin adhesive is in place. This may loosen the film before your wound is healed.  If a dressing is placed over the wound, be careful not to apply tape directly over the skin adhesive. This may cause the adhesive to be pulled off before the wound is healed.  Avoid prolonged exposure to sunlight or tanning lamps while the skin adhesive is in place. Exposure to ultraviolet light in the first year will darken the scar.  The skin adhesive will usually remain in place for 5 to 10 days, then naturally fall off  the skin. Do not pick at the adhesive film. You may need a tetanus shot if:  You cannot remember when you had your last tetanus shot.  You have never had a tetanus shot. If you get a tetanus shot, your arm may swell, get red, and feel warm to the touch. This is common and not a problem. If you need a tetanus shot and you choose not to have one, there is a rare chance of getting tetanus. Sickness from tetanus can be serious. SEEK MEDICAL CARE IF:   You have redness, swelling, or increasing pain in the wound.  You see a red line that goes  away from the wound.  You have yellowish-white fluid (pus) coming from the wound.  You have a fever.  You notice a bad smell coming from the wound or dressing.  Your wound breaks open before or after sutures have been removed.  You notice something coming out of the wound such as wood or glass.  Your wound is on your hand or foot and you cannot move a finger or toe. SEEK IMMEDIATE MEDICAL CARE IF:   Your pain is not controlled with prescribed medicine.  You have severe swelling around the wound causing pain and numbness or a change in color in your arm, hand, leg, or foot.  Your wound splits open and starts bleeding.  You have worsening numbness, weakness, or loss of function of any joint around or beyond the wound.  You develop painful lumps near the wound or on the skin anywhere on your body. MAKE SURE YOU:   Understand these instructions.  Will watch your condition.  Will get help right away if you are not doing well or get worse. Document Released: 05/03/2005 Document Revised: 07/26/2011 Document Reviewed: 10/27/2010 [facility] Patient Information 2015 [place], [place]. This information is not intended to replace advice given to you by your health care provider. Make sure you discuss any questions you have with your health care provider.

## 2014-10-03 NOTE — ED Notes (Signed)
Patient transported to X-ray 

## 2014-10-03 NOTE — ED Provider Notes (Signed)
CSN: [id number]     Arrival date & time 10/03/14  2026 History   First MD Initiated Contact with Patient 10/03/14 2111     Chief Complaint  Patient presents with  . Fall     (Consider location/radiation/quality/duration/timing/severity/associated sxs/prior Treatment) HPI Comments: Patient is a 49 year old male who was walking and tripped. He fell and landed on his face causing a laceration to his lower lip and an injury to his nose. He denies any loss of consciousness or neck pain.  Patient is a 49 y.o. male presenting with fall. The history is provided by the patient.  Fall This is a new problem. The current episode started less than 1 hour ago. The problem occurs constantly. The problem has not changed since onset.Nothing aggravates the symptoms. Nothing relieves the symptoms. He has tried nothing for the symptoms. The treatment provided no relief.    Past Medical History  Diagnosis Date  . Hypertension   . CVA (cerebral infarction) 11/18/12    Acute Rt brain by MRI   History reviewed. No pertinent past surgical history. Family History  Problem Relation Age of Onset  . Coronary artery disease Brother 61    MI treated with PCI x 2  . CVA Father    History  Substance Use Topics  . Smoking status: Never Smoker   . Smokeless tobacco: Never Used  . Alcohol Use: Yes     Comment: social    Review of Systems  All other systems reviewed and are negative.     Allergies  Percocet  Home Medications   Prior to Admission medications   Medication Sig Start Date End Date Taking? Authorizing Provider  amLODipine (NORVASC) 10 MG tablet Take 1 tablet (10 mg total) by mouth daily. NEEDS APPOINTMENT FOR FUTURE REFILLS 09/19/14  Yes Dwana Melena, PA-C  aspirin EC 81 MG EC tablet Take 1 tablet (81 mg total) by mouth daily. 11/24/12  Yes Kathlen Mody, MD  hydrALAZINE (APRESOLINE) 100 MG tablet Take 1 tablet (100 mg total) by mouth every 8 (eight) hours. 11/24/12  Yes Kathlen Mody, MD    hydrochlorothiazide (HYDRODIURIL) 25 MG tablet Take 1 tablet (25 mg total) by mouth daily. 11/24/12  Yes Kathlen Mody, MD  lisinopril (PRINIVIL,ZESTRIL) 20 MG tablet Take 1 tablet (20 mg total) by mouth 2 (two) times daily. 11/24/12  Yes Kathlen Mody, MD   BP 123/67 mmHg  Pulse 69  Temp(Src) 98.6 F (37 C) (Oral)  Resp 18  Ht 5\' 11"  (1.803 m)  Wt 217 lb (98.431 kg)  BMI 30.28 kg/m2  SpO2 95% Physical Exam  Constitutional: He is oriented to person, place, and time. He appears well-developed and well-nourished. No distress.  HENT:  Head: Normocephalic and atraumatic.  Mouth/Throat: Oropharynx is clear and moist.  There is a gaping laceration to the lower lip midline. The dentition is intact and no teeth appear to be loose.  Eyes: EOM are normal. Pupils are equal, round, and reactive to light.  Musculoskeletal: Normal range of motion.  Neurological: He is alert and oriented to person, place, and time.  Skin: Skin is warm and dry. He is not diaphoretic.  Nursing note and vitals reviewed.   ED Course  Procedures (including critical care time) Labs Review Labs Reviewed - No data to display  Imaging Review No results found.   EKG Interpretation None         MDM   Final diagnoses:  None    Patient is a 49 year old male who  presents with facial injuries sustained during the fall. He has swelling to the bridge of his nose and has small abrasion that does not need repaired. He has no septal hematoma and x-rays reveal no fracture. His main problem is a complex lip laceration which was repaired by the ENT on call, Dr. Marzetta Board.  Patient will be discharged with antibiotics, an updated tetanus shot, and follow-up with plastics in 10 days.    Geoffery Lyons, MD 10/03/14 2234

## 2014-10-03 NOTE — ED Notes (Signed)
ENT MD at bedside.

## 2014-10-03 NOTE — Consult Note (Signed)
Reason for Consult:lip laceration Location: [facility] ED-outpatient Date: 5.19.2016 Referring Physician: Dr. Judd Lien  Cameron Eaton is an 49 y.o. male.  HPI: Patient fell this evening on gym equipment, no LOC. Consulted for lip laceration. Patient states has premorbid loose tooth mandible and plan for treatment of this by dental provider.   Past Medical History  Diagnosis Date  . Hypertension   . CVA (cerebral infarction) 11/18/12    Acute Rt brain by MRI    History reviewed. No pertinent past surgical history.  Family History  Problem Relation Age of Onset  . Coronary artery disease Brother 23    MI treated with PCI x 2  . CVA Father     Social History:  reports that he has never smoked. He has never used smokeless tobacco. He reports that he drinks alcohol. He reports that he does not use illicit drugs.  Allergies:  Allergies  Allergen Reactions  . Percocet [Oxycodone-Acetaminophen]     Medications: I have reviewed the patient's current medications.  No results found for this or any previous visit (from the past 48 hour(s)).  No results found.  ROS Blood pressure 123/67, pulse 69, temperature 98.6 F (37 C), temperature source Oral, resp. rate 18, height  (1.803 m), weight 98.431 kg (217 lb), SpO2 95 %. Physical Exam Alert, NAD HEENT: full thickness laceration midline lower lip, no gross missing tissue Nose dorsum with abrasion/laceration, no deformity gross of nose Septum without hematoma Sensation over CN V distribution grossly intact EOMI  Assessment/Plan: Full thickness laceration lower lip. Plan repair in ED. Plan films nose reviewed, no gross deformity. Formal read pending. Plan f/u 10 days. Soft diet x 24 hrs. Then no seeds, sharp edged food. Ok to shower, brush teeth. No contact sports, heavy lifting until follow up visit. Apply vaseline twice daily. Unclear if Td UTD, rec booster. Ok to brush teeth. Rec deferral of dental work until well healed, appr  6 weeks.  PROCEDURE NOTE: Pre procedure diagnosis: full thickness, full height laceration lower lip Post procedure diagnosis: same Procedure: Repair full height lower lip  Local anesthesia 1% lidocaine with epi 1:100K total 4 ml infiltrated to perform bilateral infraorbital nerve blocks. Prepped with Betadine. Sharp excision of wound margins completed. Orbicularis oris approximated with interrupted 4-0 vicryl. Vermillion border and wet-dry mucosa border aligned with interrupted 4-0 chromic. Remainder vermilion and oral mucosa closed with short running 4-0 chromic. Tolerated well.    Glenna Fellows, MD [facility] [phone]

## 2014-10-03 NOTE — ED Notes (Signed)
Pt states he was at the gym looking back when walking, when he turned around he tripped over a bar thing and fell face first to floor biting lip and hitting nose, pt states nose was bleeding, bleeding controlled at this time, nose is swollen w/ small laceration, bottom portion of lip has small chunk missing in middle, bleeding controlled. Pt states teeth are sore but none missing. Pt is a/o x 4, denies dizziness or pain, states he's sore.

## 2014-10-03 NOTE — ED Notes (Signed)
Bed: QD82 Expected date:  Expected time:  Means of arrival:  Comments: EMS fall, lip laceration

## 2014-10-03 NOTE — ED Notes (Signed)
Pt given ice pack to place on nose and mouth area.

## 2014-10-03 NOTE — ED Notes (Signed)
Per EMS pt from gym, was at gym turned around and tripped over equipment, pt has laceration to lip and nose, EMS states a large portion of lip is missing, denies LOC, denies neck or back pain, pt ambulated to room w/ EMS w/o difficulty or dizziness. Pt is bleeding from nose.

## 2014-10-15 ENCOUNTER — Emergency Department (HOSPITAL_COMMUNITY)
Admission: EM | Admit: 2014-10-15 | Discharge: 2014-10-15 | Disposition: A | Discharge status: Home / Self Care | Payer: 59 | Attending: Emergency Medicine | Admitting: Emergency Medicine

## 2014-10-15 ENCOUNTER — Encounter (HOSPITAL_COMMUNITY): Payer: Self-pay | Admitting: Emergency Medicine

## 2014-10-15 DIAGNOSIS — Z79899 Other long term (current) drug therapy: Secondary | ICD-10-CM | POA: Insufficient documentation

## 2014-10-15 DIAGNOSIS — W1830XD Fall on same level, unspecified, subsequent encounter: Secondary | ICD-10-CM | POA: Insufficient documentation

## 2014-10-15 DIAGNOSIS — S01511D Laceration without foreign body of lip, subsequent encounter: Secondary | ICD-10-CM | POA: Diagnosis not present

## 2014-10-15 DIAGNOSIS — I10 Essential (primary) hypertension: Secondary | ICD-10-CM | POA: Diagnosis not present

## 2014-10-15 DIAGNOSIS — Z7982 Long term (current) use of aspirin: Secondary | ICD-10-CM | POA: Insufficient documentation

## 2014-10-15 DIAGNOSIS — Z8673 Personal history of transient ischemic attack (TIA), and cerebral infarction without residual deficits: Secondary | ICD-10-CM | POA: Diagnosis not present

## 2014-10-15 DIAGNOSIS — Z792 Long term (current) use of antibiotics: Secondary | ICD-10-CM | POA: Insufficient documentation

## 2014-10-15 NOTE — ED Notes (Signed)
Per pt, states he fell over a week ago, split lip-dissolvable stitches placed-thinks it is split again

## 2014-10-15 NOTE — ED Provider Notes (Signed)
CSN: [id number]     Arrival date & time 10/15/14  1623 History   First MD Initiated Contact with Patient 10/15/14 1750     Chief Complaint  Patient presents with  . Lip Laceration     (Consider location/radiation/quality/duration/timing/severity/associated sxs/prior Treatment) HPI Comments: Patient presents with complaint of lip laceration. Patient was seen in emergency department 12 days ago after a fall in which he sustained a large gaping laceration to his lower lip which was repaired in the ED by plastic surgery (Dr. Leta Baptist). Patient states that he had dissolvable stitches placed in the wound. Patient has noted that the wound has slightly opened again. No redness, drainage, discharge, or other signs of infection. Patient states he was prescribed medications which he has been taking. He states that he lost the business card of the doctor who repaired his wound. No other acute complaints. Onset acute. Course constant. Nothing makes symptoms better or worse.  The history is provided by the patient and medical records.    Past Medical History  Diagnosis Date  . Hypertension   . CVA (cerebral infarction) 11/18/12    Acute Rt brain by MRI   History reviewed. No pertinent past surgical history. Family History  Problem Relation Age of Onset  . Coronary artery disease Brother 74    MI treated with PCI x 2  . CVA Father    History  Substance Use Topics  . Smoking status: Never Smoker   . Smokeless tobacco: Never Used  . Alcohol Use: Yes     Comment: social    Review of Systems  Constitutional: Negative for fever.  Gastrointestinal: Negative for nausea and vomiting.  Skin: Positive for wound. Negative for color change.  Hematological: Negative for adenopathy.      Allergies  Percocet  Home Medications   Prior to Admission medications   Medication Sig Start Date End Date Taking? Authorizing Provider  amLODipine (NORVASC) 10 MG tablet Take 1 tablet (10 mg total) by mouth  daily. NEEDS APPOINTMENT FOR FUTURE REFILLS 09/19/14  Yes Dwana Melena, PA-C  aspirin EC 81 MG EC tablet Take 1 tablet (81 mg total) by mouth daily. 11/24/12  Yes Kathlen Mody, MD  cephALEXin (KEFLEX) 500 MG capsule Take 1 capsule (500 mg total) by mouth 4 (four) times daily. 10/03/14  Yes Geoffery Lyons, MD  hydrALAZINE (APRESOLINE) 100 MG tablet Take 1 tablet (100 mg total) by mouth every 8 (eight) hours. 11/24/12  Yes Kathlen Mody, MD  hydrochlorothiazide (HYDRODIURIL) 25 MG tablet Take 1 tablet (25 mg total) by mouth daily. 11/24/12  Yes Kathlen Mody, MD  lisinopril (PRINIVIL,ZESTRIL) 20 MG tablet Take 1 tablet (20 mg total) by mouth 2 (two) times daily. 11/24/12  Yes Kathlen Mody, MD   BP 142/88 mmHg  Pulse 80  Temp(Src) 98.5 F (36.9 C)  Resp 16  SpO2 96%   Physical Exam  Constitutional: He appears well-developed and well-nourished.  HENT:  Head: Normocephalic.  Patient with 1-51mm of gaping,healing laceration midline lower lip which abuts the vermillion border. No signs of infection or cellulitis. Appears much better than picture of initial wound, appears to be healing well by secondary intention.  Eyes: Conjunctivae are normal.  Neck: Normal range of motion. Neck supple.  Pulmonary/Chest: No respiratory distress.  Neurological: He is alert.  Skin: Skin is warm and dry.  Psychiatric: He has a normal mood and affect.  Nursing note and vitals reviewed.   ED Course  Procedures (including critical care time) Labs Review  Labs Reviewed - No data to display  Imaging Review No results found.   EKG Interpretation None       6:18 PM Patient seen and examined. Counseled to continue good wound care. Encouraged patient to follow-up with the plastic surgeon for further evaluation of the wound. Again, reiterated signs and symptoms of infection which should cause them to return to the emergency department.   Vital signs reviewed and are as follows: BP 142/88 mmHg  Pulse 80  Temp(Src) 98.5  F (36.9 C)  Resp 16  SpO2 96%   MDM   Final diagnoses:  Lip laceration, subsequent encounter   Patient with lip laceration. There appears to been mild dehiscence of the wound since closure. No signs of infection. Will not attempt to reclose today. Will have patient follow-up with plastic surgery for further consideration. At this time appears to be well healing by secondary intention.    Renne Crigler, PA-C 10/15/14 1823  Rolan Bucco, MD 10/15/14 [phone]

## 2014-10-15 NOTE — Discharge Instructions (Signed)
Please read and follow all provided instructions.  Your diagnoses today include:  1. Lip laceration, subsequent encounter     Tests performed today include:  Vital signs. See below for your results today.   Medications prescribed:   None  Home care instructions:  Follow any educational materials contained in this packet.  Follow-up instructions: Please follow-up with your primary care provider as needed for further evaluation of your symptoms.  Follow-up with Dr. Leta Baptist in the next week for a recheck of your wound. Call for an appointment.    Return instructions:   Please return to the Emergency Department if you experience worsening symptoms.   Please return if you have any other emergent concerns.  Additional Information:  Your vital signs today were: BP 142/88 mmHg   Pulse 80   Temp(Src) 98.5 F (36.9 C)   Resp 16   SpO2 96% If your blood pressure (BP) was elevated above 135/85 this visit, please have this repeated by your doctor within one month. ---------------

## 2014-11-03 IMAGING — US US RENAL
1 series · 14 of 25 positions shown · non-contrast
Comparison: None

CLINICAL DATA: Abnormal parenchyma on renal Doppler exam, history
acute renal failure

RENAL/URINARY TRACT ULTRASOUND COMPLETE

[Series 1: us renal · 0.32mm/px · 14 of 34 slices shown]
[im 1/34]
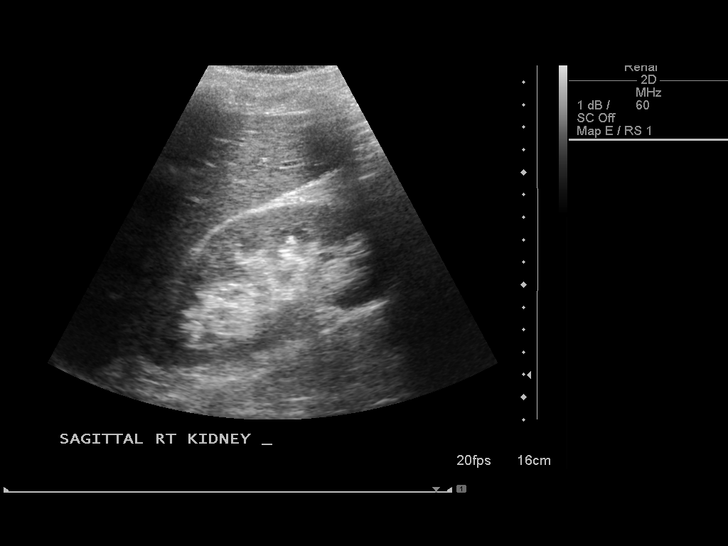
[im 3/34]
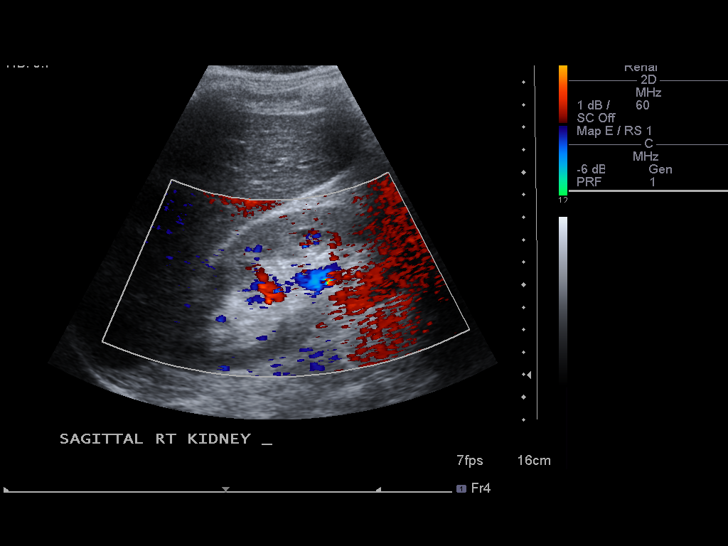
[im 6/34]
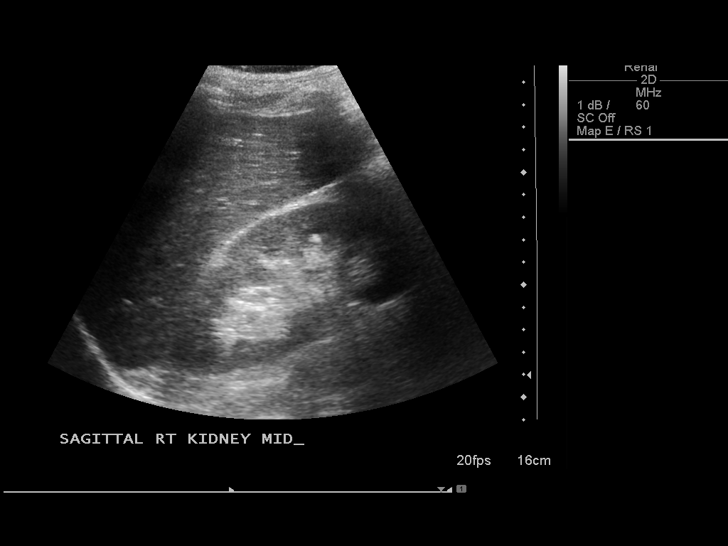
[im 9/34]
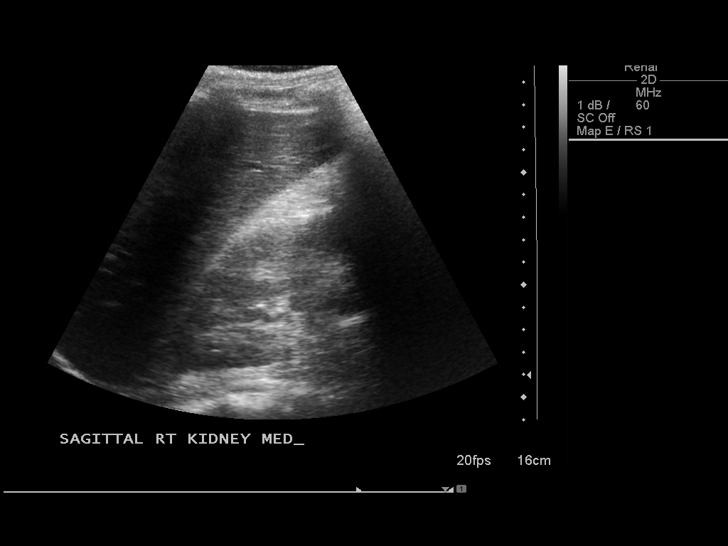
[im 12/34]
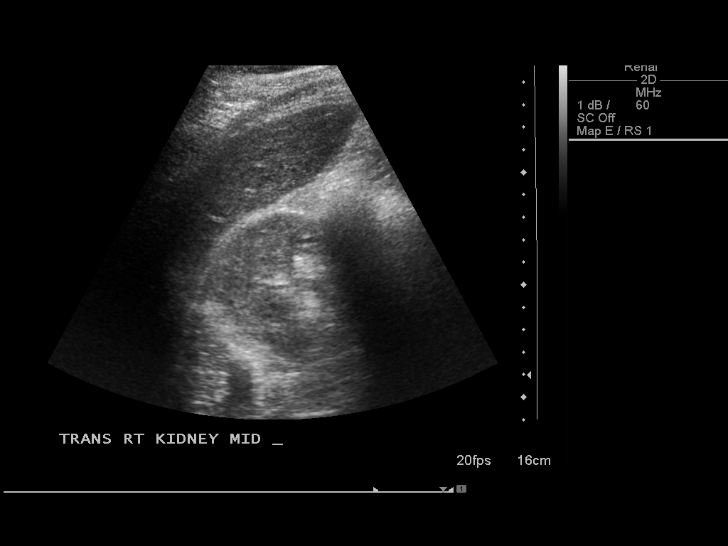
[im 13/34]
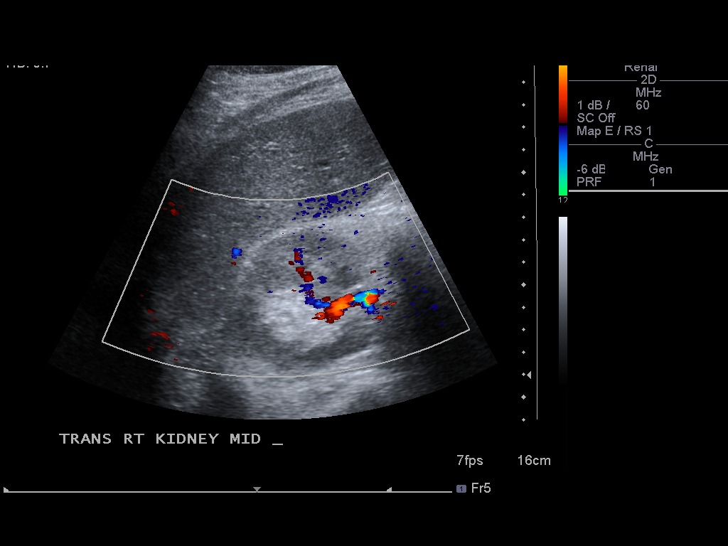
[im 16/34]
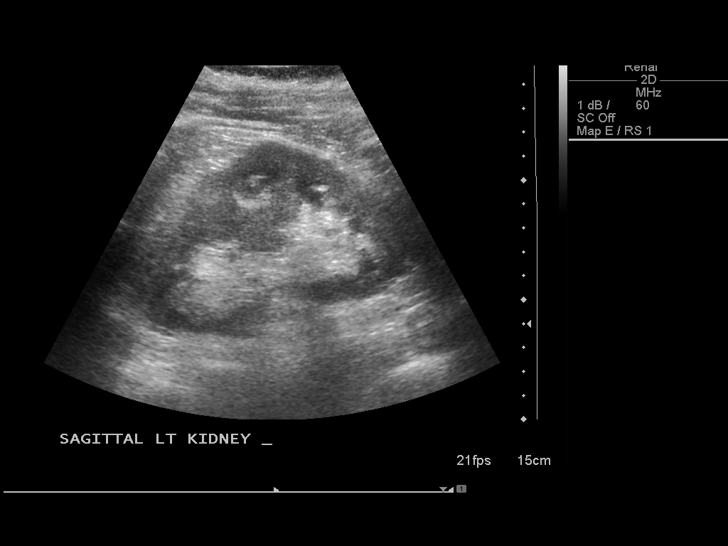
[im 18/34]
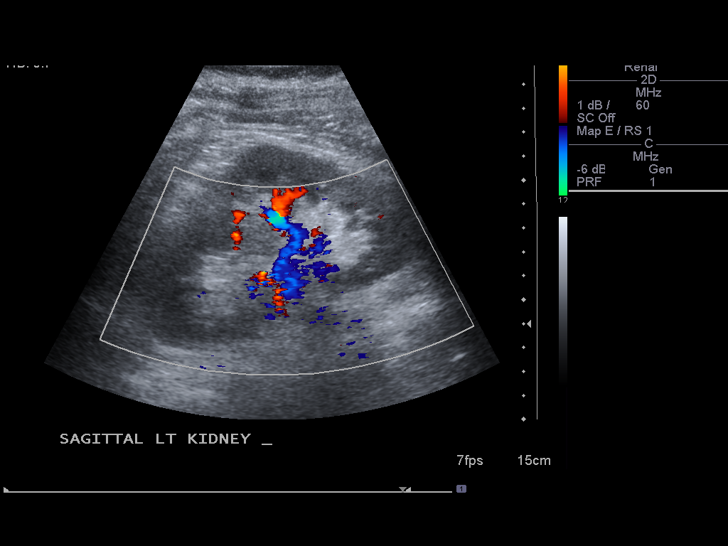
[im 21/34]
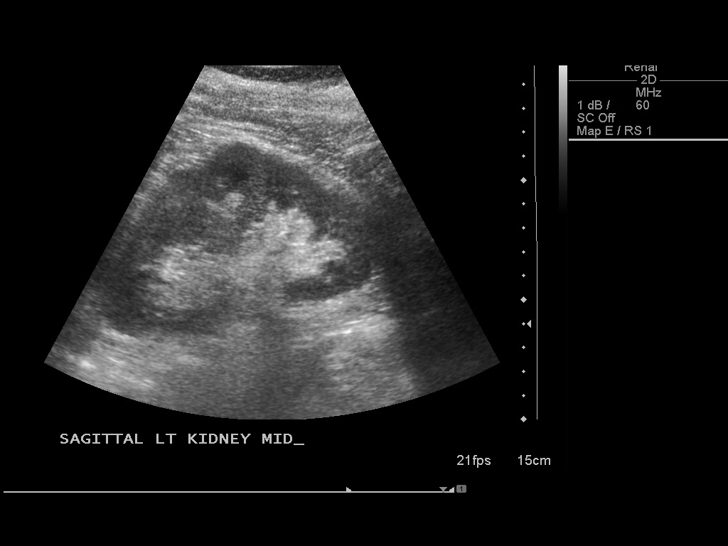
[im 23/34]
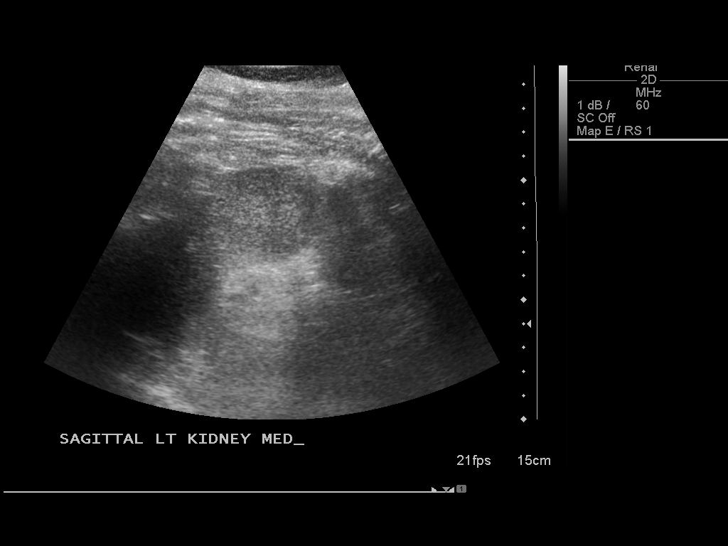
[im 25/34]
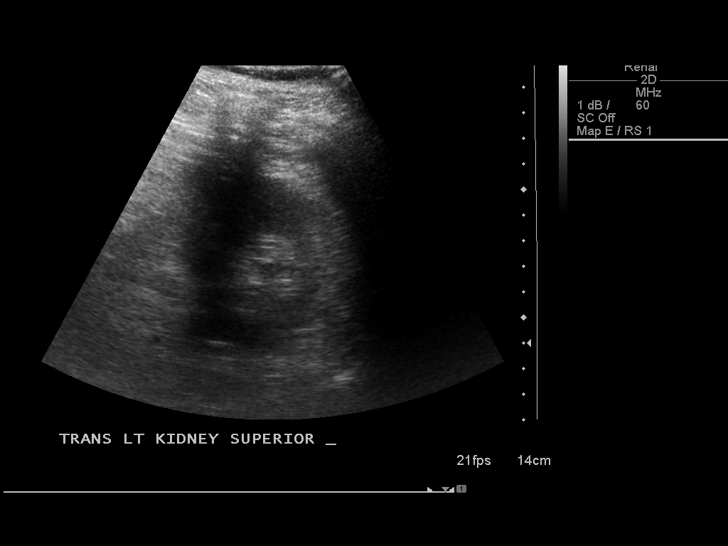
[im 28/34]
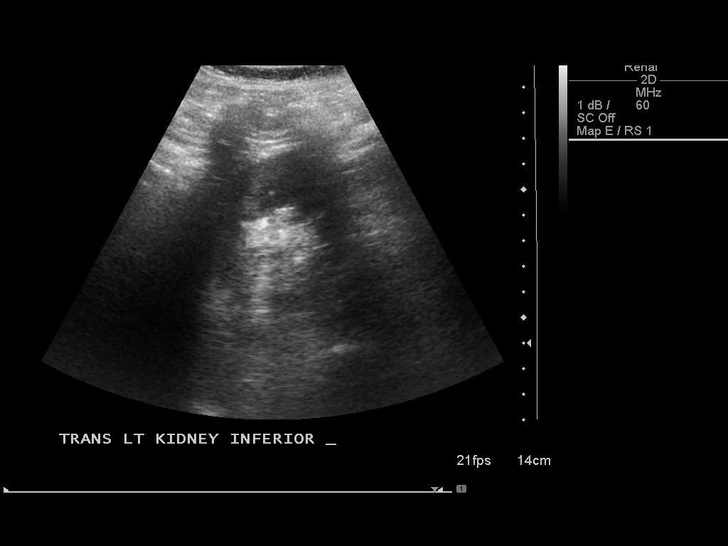
[im 31/34]
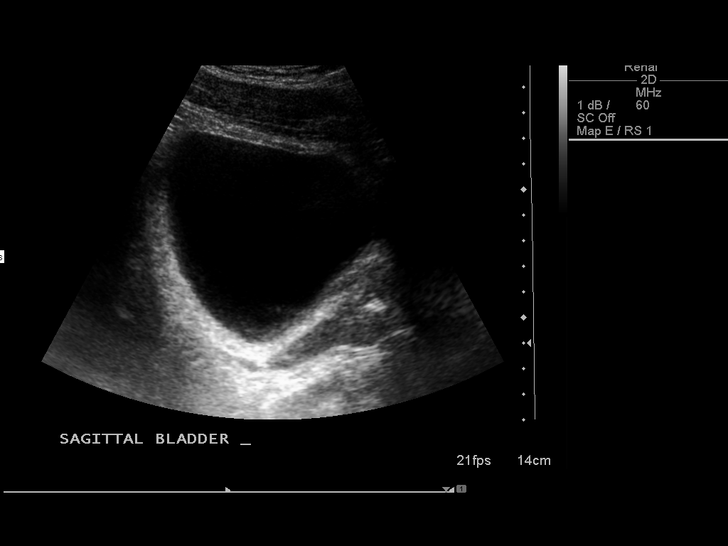
[im 34/34]
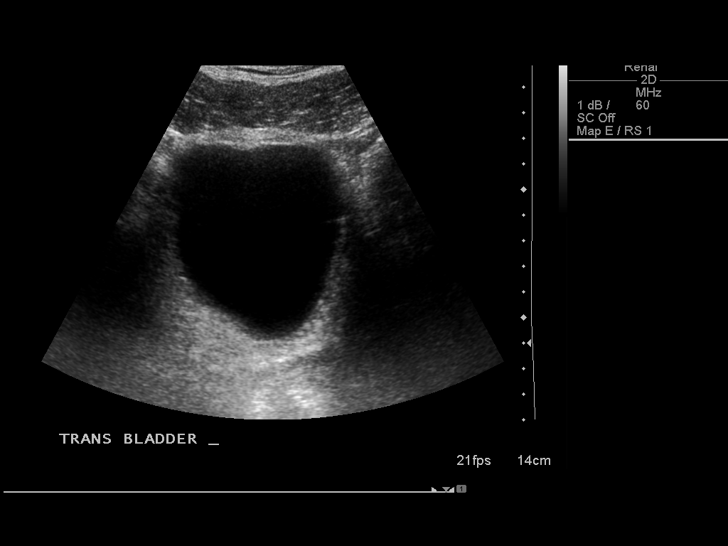

[14 of 25 positions shown; findings below may reference images not displayed]

FINDINGS: Right Kidney:  12.1 cm length. Normal cortical thickness.
Minimally increased cortical echogenicity.  No mass, hydronephrosis
or perinephric fluid.  6 mm echogenic focus mid right kidney
without definite shadowing question nonobstructing calculus.

Left Kidney:  11.4 cm length.  Normal cortical thickness.  Question
slightly increased cortical echogenicity.  No mass, hydronephrosis
or shadowing calcification.  No perinephric fluid.

Bladder:  Well-distended, unremarkable.
IMPRESSION: Question 6 mm nonobstructing calculus right kidney.
Slightly increased renal cortical echogenicity bilaterally
suggesting medical renal disease.

## 2017-05-12 ENCOUNTER — Emergency Department (HOSPITAL_COMMUNITY): Payer: Self-pay

## 2017-05-12 ENCOUNTER — Encounter (HOSPITAL_COMMUNITY): Payer: Self-pay

## 2017-05-12 ENCOUNTER — Emergency Department (HOSPITAL_COMMUNITY)
Admission: EM | Admit: 2017-05-12 | Discharge: 2017-05-12 | Disposition: A | Discharge status: Home / Self Care | Payer: Self-pay | Attending: Emergency Medicine | Admitting: Emergency Medicine

## 2017-05-12 ENCOUNTER — Other Ambulatory Visit: Payer: Self-pay

## 2017-05-12 DIAGNOSIS — Z8673 Personal history of transient ischemic attack (TIA), and cerebral infarction without residual deficits: Secondary | ICD-10-CM | POA: Insufficient documentation

## 2017-05-12 DIAGNOSIS — I10 Essential (primary) hypertension: Secondary | ICD-10-CM | POA: Insufficient documentation

## 2017-05-12 DIAGNOSIS — R112 Nausea with vomiting, unspecified: Secondary | ICD-10-CM | POA: Insufficient documentation

## 2017-05-12 DIAGNOSIS — Z79899 Other long term (current) drug therapy: Secondary | ICD-10-CM | POA: Insufficient documentation

## 2017-05-12 DIAGNOSIS — K8050 Calculus of bile duct without cholangitis or cholecystitis without obstruction: Secondary | ICD-10-CM | POA: Insufficient documentation

## 2017-05-12 DIAGNOSIS — Z7982 Long term (current) use of aspirin: Secondary | ICD-10-CM | POA: Insufficient documentation

## 2017-05-12 LAB — URINALYSIS, ROUTINE W REFLEX MICROSCOPIC
BILIRUBIN URINE: NEGATIVE
GLUCOSE, UA: 50 mg/dL — AB
Hgb urine dipstick: NEGATIVE
Ketones, ur: NEGATIVE mg/dL
Leukocytes, UA: NEGATIVE
Nitrite: NEGATIVE
PH: 8 (ref 5.0–8.0)
PROTEIN: NEGATIVE mg/dL
Specific Gravity, Urine: 1.014 (ref 1.005–1.030)

## 2017-05-12 LAB — COMPREHENSIVE METABOLIC PANEL
ALBUMIN: 4.2 g/dL (ref 3.5–5.0)
ALK PHOS: 49 U/L (ref 38–126)
ALT: 16 U/L — AB (ref 17–63)
AST: 28 U/L (ref 15–41)
Anion gap: 13 (ref 5–15)
BUN: 8 mg/dL (ref 6–20)
CO2: 23 mmol/L (ref 22–32)
Calcium: 9.3 mg/dL (ref 8.9–10.3)
Chloride: 102 mmol/L (ref 101–111)
Creatinine, Ser: 0.91 mg/dL (ref 0.61–1.24)
GFR calc Af Amer: >60 mL/min (ref >60)
GLUCOSE: 159 mg/dL — AB (ref 65–99)
POTASSIUM: 3.3 mmol/L — AB (ref 3.5–5.1)
Sodium: 138 mmol/L (ref 135–145)
Total Bilirubin: 1 mg/dL (ref 0.3–1.2)
Total Protein: 7.8 g/dL (ref 6.5–8.1)

## 2017-05-12 LAB — CBC
HEMATOCRIT: 43.3 % (ref 39.0–52.0)
Hemoglobin: 15 g/dL (ref 13.0–17.0)
MCH: 30.2 pg (ref 26.0–34.0)
MCHC: 34.6 g/dL (ref 30.0–36.0)
MCV: 87.1 fL (ref 78.0–100.0)
PLATELETS: 207 10*3/uL (ref 150–400)
RBC: 4.97 MIL/uL (ref 4.22–5.81)
RDW: 13.3 % (ref 11.5–15.5)
WBC: 8.7 10*3/uL (ref 4.0–10.5)

## 2017-05-12 LAB — LIPASE, BLOOD: Lipase: 18 U/L (ref 11–51)

## 2017-05-12 MED ORDER — ONDANSETRON HCL 4 MG/2ML IJ SOLN
4.0000 mg | Freq: Once | INTRAMUSCULAR | Status: AC
  Administered 2017-05-12: 4 mg via INTRAVENOUS
  Filled 2017-05-12: qty 2

## 2017-05-12 MED ORDER — MORPHINE SULFATE (PF) 4 MG/ML IV SOLN
4.0000 mg | Freq: Once | INTRAVENOUS | Status: AC
  Administered 2017-05-12: 4 mg via INTRAVENOUS
  Filled 2017-05-12: qty 1

## 2017-05-12 MED ORDER — ONDANSETRON HCL 4 MG PO TABS: 4.0000 mg | ORAL_TABLET | Freq: Four times a day (QID) | ORAL | 0 refills | Status: DC | PRN

## 2017-05-12 MED ORDER — MORPHINE SULFATE (PF) 4 MG/ML IV SOLN
2.0000 mg | Freq: Once | INTRAVENOUS | Status: AC
  Administered 2017-05-12: 2 mg via INTRAVENOUS
  Filled 2017-05-12: qty 1

## 2017-05-12 MED ORDER — HYDROCODONE-ACETAMINOPHEN 5-325 MG PO TABS: 1.0000 | ORAL_TABLET | ORAL | 0 refills | Status: DC | PRN

## 2017-05-12 MED ORDER — SODIUM CHLORIDE 0.9 % IV BOLUS (SEPSIS)
500.0000 mL | Freq: Once | INTRAVENOUS | Status: AC
  Administered 2017-05-12: 500 mL via INTRAVENOUS

## 2017-05-12 MED ORDER — HYDROMORPHONE HCL 1 MG/ML IJ SOLN
1.0000 mg | Freq: Once | INTRAMUSCULAR | Status: AC
  Administered 2017-05-12: 1 mg via INTRAVENOUS
  Filled 2017-05-12: qty 1

## 2017-05-12 NOTE — ED Notes (Signed)
Dr. Ranae Palms notified of pain, pt notified we will address pain after ultrasound results per Dr. Ranae Palms

## 2017-05-12 NOTE — ED Notes (Signed)
Pt returned from ultrasound

## 2017-05-12 NOTE — ED Provider Notes (Signed)
[facility] EMERGENCY DEPARTMENT Provider Note   CSN: [id number] Arrival date & time: 05/12/17  9833     History   Chief Complaint Chief Complaint  Patient presents with  . Abdominal Pain    HPI Cameron Eaton is a 51 y.o. male.  HPI Patient states that he developed abdominal pain at 4:00 this morning.  Gradually worsened through the night.  Complains of pain mostly on the right side of his abdomen.  No radiation of the pain.  Associated with nausea and some forced vomiting.  No fever or chills.  Normal bowel movement this morning.  No previously similar symptoms.  No previous abdominal surgeries.  Patient last ate cheese dip yesterday evening.  Several family members had the same dip without similar symptoms. Past Medical History:  Diagnosis Date  . CVA (cerebral infarction) 11/18/12   Acute Rt brain by MRI  . Hypertension     Patient Active Problem List   Diagnosis Date Noted  . PRES (posterior reversible encephalopathy syndrome) 02/28/2013  . Family history of coronary artery disease 11/24/2012  . Abnormal EKG- LVH 11/24/2012  . 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 Hypertensive urgency 11/17/2012  . Headache on admission 11/17/2012  . Hypertension     History reviewed. No pertinent surgical history.     Home Medications    Prior to Admission medications   Medication Sig Start Date End Date Taking? Authorizing Provider  amLODipine (NORVASC) 10 MG tablet Take 1 tablet (10 mg total) by mouth daily. NEEDS APPOINTMENT FOR FUTURE REFILLS 09/19/14  Yes Dwana Melena, PA-C  hydrALAZINE (APRESOLINE) 100 MG tablet Take 1 tablet (100 mg total) by mouth every 8 (eight) hours. 11/24/12  Yes Kathlen Mody, MD  hydrochlorothiazide (HYDRODIURIL) 25 MG tablet Take 1 tablet (25 mg total) by mouth daily. 11/24/12  Yes Kathlen Mody, MD  ibuprofen (ADVIL,MOTRIN) 200 MG tablet Take 200 mg by mouth every 6 (six) hours as needed for  moderate pain.   Yes [provider]  lisinopril (PRINIVIL,ZESTRIL) 20 MG tablet Take 1 tablet (20 mg total) by mouth 2 (two) times daily. 11/24/12  Yes Kathlen Mody, MD  metoprolol succinate (TOPROL-XL) 50 MG 24 hr tablet Take 50 mg by mouth daily. Take with or immediately following a meal.   Yes [provider]  aspirin EC 81 MG EC tablet Take 1 tablet (81 mg total) by mouth daily. Patient not taking: Reported on 05/12/2017 11/24/12   Kathlen Mody, MD  cephALEXin (KEFLEX) 500 MG capsule Take 1 capsule (500 mg total) by mouth 4 (four) times daily. Patient not taking: Reported on 05/12/2017 10/03/14   Geoffery Lyons, MD  HYDROcodone-acetaminophen (NORCO) 5-325 MG tablet Take 1-2 tablets by mouth every 4 (four) hours as needed for severe pain. 05/12/17   Loren Racer, MD  ondansetron (ZOFRAN) 4 MG tablet Take 1 tablet (4 mg total) by mouth 4 (four) times daily as needed for nausea or vomiting. 05/12/17   Loren Racer, MD    Family History Family History  Problem Relation Age of Onset  . CVA Father   . Coronary artery disease Brother 36       MI treated with PCI x 2    Social History Social History   Tobacco Use  . Smoking status: Never Smoker  . Smokeless tobacco: Never Used  Substance Use Topics  . Alcohol use: Yes    Comment: social  . Drug use: No  Allergies   Percocet [oxycodone-acetaminophen]   Review of Systems Review of Systems  Constitutional: Negative for chills and fever.  Respiratory: Negative for cough and shortness of breath.   Cardiovascular: Negative for chest pain.  Gastrointestinal: Positive for abdominal pain, nausea and vomiting. Negative for blood in stool, constipation and diarrhea.  Genitourinary: Negative for dysuria, flank pain, frequency and hematuria.  Musculoskeletal: Negative for back pain and myalgias.  Skin: Negative for rash and wound.  Neurological: Negative for dizziness, weakness, light-headedness, numbness and  headaches.  All other systems reviewed and are negative.    Physical Exam Updated Vital Signs BP (!) 163/106   Pulse 73   Temp 97.7 F (36.5 C) (Oral)   Resp 17   SpO2 94%   Physical Exam  Constitutional: He is oriented to person, place, and time. He appears well-developed and well-nourished.  HENT:  Head: Normocephalic and atraumatic.  Mouth/Throat: Oropharynx is clear and moist.  Eyes: EOM are normal. Pupils are equal, round, and reactive to light.  Neck: Normal range of motion. Neck supple.  Cardiovascular: Normal rate and regular rhythm.  Pulmonary/Chest: Effort normal and breath sounds normal.  Abdominal: Soft. Bowel sounds are normal. There is tenderness. There is no rebound and no guarding.  Right upper quadrant tenderness to palpation.  No rebound or guarding.  Musculoskeletal: Normal range of motion. He exhibits no edema or tenderness.  No CVA tenderness bilaterally.  Neurological: He is alert and oriented to person, place, and time.  Moves all extremities without focal deficit.  Sensation fully intact.  Skin: Skin is warm and dry. No rash noted. No erythema.  Psychiatric: He has a normal mood and affect. His behavior is normal.  Nursing note and vitals reviewed.    ED Treatments / Results  Labs (all labs ordered are listed, but only abnormal results are displayed) Labs Reviewed  COMPREHENSIVE METABOLIC PANEL - Abnormal; Notable for the following components:      Result Value   Potassium 3.3 (*)    Glucose, Bld 159 (*)    ALT 16 (*)    All other components within normal limits  URINALYSIS, ROUTINE W REFLEX MICROSCOPIC - Abnormal; Notable for the following components:   APPearance HAZY (*)    Glucose, UA 50 (*)    All other components within normal limits  LIPASE, BLOOD  CBC    EKG  EKG Interpretation None       Radiology US Abdomen Limited  Result Date: 05/12/2017 CLINICAL DATA:  Right upper quadrant pain EXAM: ULTRASOUND ABDOMEN LIMITED RIGHT  UPPER QUADRANT COMPARISON:  None in PACs FINDINGS: Gallbladder: The gallbladder is contracted and filled with stones and exhibits a wall echo shadow configuration. The gallbladder wall is mildly thickened at 5 mm. There is no pericholecystic fluid or positive sonographic Murphy's sign. Common bile duct: Diameter: 6 mm Liver: The hepatic echotexture is normal. The surface contour is smooth. There is no focal mass nor ductal dilation. Portal vein is patent on color Doppler imaging with normal direction of blood flow towards the liver. Incidental note is made of a lower pole simple appearing cyst in the right kidney measuring 2.8 cm in greatest dimension. IMPRESSION: Multiple gallstones without sonographic evidence of acute cholecystitis. Normal appearing liver, top-normal common bile duct diameter without evidence of intraluminal stones or sludge. Simple appearing lower pole cyst in the right kidney. Electronically Signed   By: Cashmere Harmes  [place] M.D.   On: 05/12/2017 12:24    Procedures Procedures (including critical care time)  Medications Ordered in ED Medications  sodium chloride 0.9 % bolus 500 mL (0 mLs Intravenous Stopped 05/12/17 1029)  ondansetron (ZOFRAN) injection 4 mg (4 mg Intravenous Given 05/12/17 1003)  morphine 4 MG/ML injection 2 mg (2 mg Intravenous Given 05/12/17 1003)  morphine 4 MG/ML injection 4 mg (4 mg Intravenous Given 05/12/17 1034)  HYDROmorphone (DILAUDID) injection 1 mg (1 mg Intravenous Given 05/12/17 1250)     Initial Impression / Assessment and Plan / ED Course  I have reviewed the triage vital signs and the nursing notes.  Pertinent labs & imaging results that were available during my care of the patient were reviewed by me and considered in my medical decision making (see chart for details).     Patient's pain is controlled.  Abdomen is soft and nontender.  Ultrasound with multiple gallstones in the gallbladder without evidence of cholecystitis.  Liver function  tests and white blood cell count is normal.  Advised low-fat diet and follow-up with general surgery as an outpatient.  Return precautions have been given.  Final Clinical Impressions(s) / ED Diagnoses   Final diagnoses:  Biliary colic    ED Discharge Orders        Ordered    HYDROcodone-acetaminophen (NORCO) 5-325 MG tablet  Every 4 hours PRN     05/12/17 1401    ondansetron (ZOFRAN) 4 MG tablet  4 times daily PRN     05/12/17 1401       Loren RacerYelverton, Cyndia Degraff, MD 05/12/17 1402

## 2017-05-12 NOTE — ED Triage Notes (Signed)
Pt reports generalized abdominal pain after eating cheese dip last night. Pt also reports one episode of vomiting. Skin warm and dry.

## 2017-05-12 NOTE — ED Notes (Signed)
Patient transported to Ultrasound 

## 2017-05-12 NOTE — ED Notes (Signed)
Pt stable, ambulatory, states understanding of discharge instructions 

## 2017-05-12 NOTE — ED Notes (Signed)
ED Provider at bedside. 

## 2017-11-14 ENCOUNTER — Ambulatory Visit (INDEPENDENT_AMBULATORY_CARE_PROVIDER_SITE_OTHER): Payer: BLUE CROSS/BLUE SHIELD | Admitting: Orthopaedic Surgery

## 2017-11-14 ENCOUNTER — Encounter (INDEPENDENT_AMBULATORY_CARE_PROVIDER_SITE_OTHER): Payer: Self-pay | Admitting: Orthopaedic Surgery

## 2017-11-14 ENCOUNTER — Ambulatory Visit (INDEPENDENT_AMBULATORY_CARE_PROVIDER_SITE_OTHER): Payer: BLUE CROSS/BLUE SHIELD

## 2017-11-14 DIAGNOSIS — M1612 Unilateral primary osteoarthritis, left hip: Secondary | ICD-10-CM | POA: Diagnosis not present

## 2017-11-14 DIAGNOSIS — M25552 Pain in left hip: Secondary | ICD-10-CM | POA: Diagnosis not present

## 2017-11-14 NOTE — Progress Notes (Signed)
Office Visit Note   Patient: Cameron Eaton           Date of Birth: December 18, 1965           MRN: [id number] Visit Date: 11/14/2017              Requested by: Renford Dills, MD [address] [place], [place] [address] PCP: Renford Dills, MD   Assessment & Plan: Visit Diagnoses:  1. Pain in left hip   2. Unilateral primary osteoarthritis, left hip     Plan: I do agree with setting him up for a left total hip arthroplasty surgery.  At this point intra-articular injection would not be useful at all due to there is no joint space left knee has severe arthritic findings on plain films.  He is already been through home exercise program as well as strengthening.  This been going on for a year and a half.  He is taking anti-inflammatories but they are detrimentally affecting his blood pressure now and potentially his kidneys.  I went over his x-rays with him in detail showing him the extent of his severe arthritis in his left hip.  We talked about hip replacement surgery.  I talked about the risk and benefits of the surgery showing him a hip model explained in detail what is intraoperative and postoperative course were involved.  All question concerns were answered and addressed.  He does wish to proceed with this and I agree based on his physical exam and x-ray findings.  Follow-Up Instructions: Return for 2 weeks post-op.   Orders:  Orders Placed This Encounter  Procedures  . XR HIP UNILAT W OR W/O PELVIS 1V LEFT   No orders of the defined types were placed in this encounter.     Procedures: No procedures performed   Clinical Data: No additional findings.   Subjective: Chief Complaint  Patient presents with  . Left Hip - Pain  The patient is extremely pleasant 52 year old gentleman with debilitating left hip pain for about a year and a half now.  He plays a lot of racquetball over the years and still plays.  He also is an avid golfer and played football and  basketball.  He is an avid Counselling psychologist as well.  He is been getting worsening pain for last year and a half now is gotten where he is walking with a limp.  His pain is on a daily basis.  He has been told in the past when someone is looking at his back that he had some significant issues with his left hip.  He does have a history of high blood pressure and takes medications for this.  He is otherwise an active individual.  At this point his pain is daily and it can be 10 out of 10 at times.  His left hip pain is detrimentally affect is active daily living, his quality of life, his mobility.  At this point he would like to be considered for a left total hip arthroplasty.  He is tried and failed all forms of conservative treatment.  He is very active and strong individual he is very strong hips and thighs as well as his back.  He is already worked to a significant exercise program for this.  He still exercises daily.  He is not obese individual with a BMI of 30.  He is not a diabetic or smoker.  HPI  Review of Systems  He currently denies any headache, chest pain,  shortness of breath, fever, chills, nausea, vomiting. Objective: Vital Signs: There were no vitals taken for this visit.  Physical Exam He is alert and oriented x3 and in no acute distress Ortho Exam Examination of his right hip is normal examination of his left hip she has severe stiffness with attempts of internal and external rotation.  His significant pain in the groin with those attempts at motion.  He cannot tie his shoe or put on a sock on that side but he can on the left side. Specialty Comments:  No specialty comments available.  Imaging: Xr Hip Unilat W Or W/o Pelvis 1v Left  Result Date: 11/14/2017 An AP pelvis and lateral left hip show severe osteoarthritis and degenerative joint disease of left hip.  There is complete loss of joint space.  There is particular osteophytes and sclerotic changes.  Cystic changes in the femoral head  and the acetabulum.    PMFS History: Patient Active Problem List   Diagnosis Date Noted  . Unilateral primary osteoarthritis, left hip 11/14/2017  . PRES (posterior reversible encephalopathy syndrome) 02/28/2013  . Family history of coronary artery disease 11/24/2012  . Abnormal EKG- LVH 11/24/2012  . 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 Hypertensive urgency 11/17/2012  . Headache on admission 11/17/2012  . Hypertension    Past Medical History:  Diagnosis Date  . CVA (cerebral infarction) 11/18/12   Acute Rt brain by MRI  . Hypertension     Family History  Problem Relation Age of Onset  . CVA Father   . Coronary artery disease Brother 30       MI treated with PCI x 2    History reviewed. No pertinent surgical history. Social History   Occupational History    Employer: [employer]  Tobacco Use  . Smoking status: Never Smoker  . Smokeless tobacco: Never Used  Substance and Sexual Activity  . Alcohol use: Yes    Comment: social  . Drug use: No  . Sexual activity: Yes

## 2017-11-29 ENCOUNTER — Other Ambulatory Visit (INDEPENDENT_AMBULATORY_CARE_PROVIDER_SITE_OTHER): Payer: Self-pay | Admitting: Physician Assistant

## 2017-11-29 ENCOUNTER — Other Ambulatory Visit (INDEPENDENT_AMBULATORY_CARE_PROVIDER_SITE_OTHER): Payer: Self-pay

## 2017-12-05 NOTE — Patient Instructions (Signed)
Cameron Eaton  12/05/2017   Your procedure is scheduled on: 12-09-17   Report to [facility] Main  Entrance    Report to admitting at 12:30PM    Call this number if you have problems the morning of surgery [phone]      Remember: Do not eat food :After Midnight. YOU MAY HAVE CLEAR LIQUIDS UNTIL 9:00AM. NOTHING AFTER 9:00AM     Take these medicines the morning of surgery with A SIP OF WATER: AMLODIPINE, HYDRALAZINE, METOPROLOL                                 You may not have any metal on your body including hair pins and              piercings  Do not wear jewelry, make-up, lotions, powders or perfumes, deodorant              Men may shave face and neck.   Do not bring valuables to the hospital. [facility] IS NOT             RESPONSIBLE   FOR VALUABLES.  Contacts, dentures or bridgework may not be worn into surgery.  Leave suitcase in the car. After surgery it may be brought to your room.                  Please read over the following fact sheets you were given: _____________________________________________________________________     CLEAR LIQUID DIET   Foods Allowed                                                                     Foods Excluded  Coffee and tea, regular and decaf                             liquids that you cannot  Plain Jell-O in any flavor                                             see through such as: Fruit ices (not with fruit pulp)                                     milk, soups, orange juice  Iced Popsicles                                    All solid food Carbonated beverages, regular and diet                                    Cranberry, grape and apple juices Sports drinks like Gatorade Lightly seasoned clear broth or consume(fat free) Sugar, honey syrup  Sample Menu Breakfast  Lunch                                     Supper Cranberry juice                    Beef broth                             Chicken broth Jell-O                                     Grape juice                           Apple juice Coffee or tea                        Jell-O                                      Popsicle                                                Coffee or tea                        Coffee or tea  _____________________________________________________________________  [facility] - Preparing for Surgery Before surgery, you can play an important role.  Because skin is not sterile, your skin needs to be as free of germs as possible.  You can reduce the number of germs on your skin by washing with CHG (chlorahexidine gluconate) soap before surgery.  CHG is an antiseptic cleaner which kills germs and bonds with the skin to continue killing germs even after washing. Please DO NOT use if you have an allergy to CHG or antibacterial soaps.  If your skin becomes reddened/irritated stop using the CHG and inform your nurse when you arrive at Short Stay. Do not shave (including legs and underarms) for at least 48 hours prior to the first CHG shower.  You may shave your face/neck. Please follow these instructions carefully:  1.  Shower with CHG Soap the night before surgery and the  morning of Surgery.  2.  If you choose to wash your hair, wash your hair first as usual with your  normal  shampoo.  3.  After you shampoo, rinse your hair and body thoroughly to remove the  shampoo.                           4.  Use CHG as you would any other liquid soap.  You can apply chg directly  to the skin and wash                       Gently with a scrungie or clean washcloth.  5.  Apply the CHG Soap to your body ONLY FROM THE NECK DOWN.   Do not use on face/ open  Wound or open sores. Avoid contact with eyes, ears mouth and genitals (private parts).                       Wash face,  Genitals (private parts) with your normal soap.             6.  Wash thoroughly, paying special  attention to the area where your surgery  will be performed.  7.  Thoroughly rinse your body with warm water from the neck down.  8.  DO NOT shower/wash with your normal soap after using and rinsing off  the CHG Soap.                9.  Pat yourself dry with a clean towel.            10.  Wear clean pajamas.            11.  Place clean sheets on your bed the night of your first shower and do not  sleep with pets. Day of Surgery : Do not apply any lotions/deodorants the morning of surgery.  Please wear clean clothes to the hospital/surgery center.  FAILURE TO FOLLOW THESE INSTRUCTIONS MAY RESULT IN THE CANCELLATION OF YOUR SURGERY PATIENT SIGNATURE_________________________________  NURSE SIGNATURE__________________________________  ________________________________________________________________________   Cameron Eaton  An incentive spirometer is a tool that can help keep your lungs clear and active. This tool measures how well you are filling your lungs with each breath. Taking long deep breaths may help reverse or decrease the chance of developing breathing (pulmonary) problems (especially infection) following:  A long period of time when you are unable to move or be active. BEFORE THE PROCEDURE   If the spirometer includes an indicator to show your best effort, your nurse or respiratory therapist will set it to a desired goal.  If possible, sit up straight or lean slightly forward. Try not to slouch.  Hold the incentive spirometer in an upright position. INSTRUCTIONS FOR USE  1. Sit on the edge of your bed if possible, or sit up as far as you can in bed or on a chair. 2. Hold the incentive spirometer in an upright position. 3. Breathe out normally. 4. Place the mouthpiece in your mouth and seal your lips tightly around it. 5. Breathe in slowly and as deeply as possible, raising the piston or the ball toward the top of the column. 6. Hold your breath for 3-5 seconds or for  as long as possible. Allow the piston or ball to fall to the bottom of the column. 7. Remove the mouthpiece from your mouth and breathe out normally. 8. Rest for a few seconds and repeat Steps 1 through 7 at least 10 times every 1-2 hours when you are awake. Take your time and take a few normal breaths between deep breaths. 9. The spirometer may include an indicator to show your best effort. Use the indicator as a goal to work toward during each repetition. 10. After each set of 10 deep breaths, practice coughing to be sure your lungs are clear. If you have an incision (the cut made at the time of surgery), support your incision when coughing by placing a pillow or rolled up towels firmly against it. Once you are able to get out of bed, walk around indoors and cough well. You may stop using the incentive spirometer when instructed by your caregiver.  RISKS AND COMPLICATIONS  Take your time so you do not  get dizzy or light-headed.  If you are in pain, you may need to take or ask for pain medication before doing incentive spirometry. It is harder to take a deep breath if you are having pain. AFTER USE  Rest and breathe slowly and easily.  It can be helpful to keep track of a log of your progress. Your caregiver can provide you with a simple table to help with this. If you are using the spirometer at home, follow these instructions: SEEK MEDICAL CARE IF:   You are having difficultly using the spirometer.  You have trouble using the spirometer as often as instructed.  Your pain medication is not giving enough relief while using the spirometer.  You develop fever of 100.5 F (38.1 C) or higher. SEEK IMMEDIATE MEDICAL CARE IF:   You cough up bloody sputum that had not been present before.  You develop fever of 102 F (38.9 C) or greater.  You develop worsening pain at or near the incision site. MAKE SURE YOU:   Understand these instructions.  Will watch your condition.  Will get  help right away if you are not doing well or get worse. Document Released: 09/13/2006 Document Revised: 07/26/2011 Document Reviewed: 11/14/2006 [facility] Patient Information 2014 [place], [place].   ________________________________________________________________________

## 2017-12-06 ENCOUNTER — Other Ambulatory Visit: Payer: Self-pay

## 2017-12-06 ENCOUNTER — Encounter (HOSPITAL_COMMUNITY): Payer: Self-pay

## 2017-12-06 ENCOUNTER — Encounter (HOSPITAL_COMMUNITY)
Admission: RE | Admit: 2017-12-06 | Discharge: 2017-12-06 | Disposition: A | Discharge status: Home / Self Care | Payer: BLUE CROSS/BLUE SHIELD | Source: Ambulatory Visit | Attending: Orthopaedic Surgery | Admitting: Orthopaedic Surgery

## 2017-12-06 DIAGNOSIS — R9431 Abnormal electrocardiogram [ECG] [EKG]: Secondary | ICD-10-CM | POA: Insufficient documentation

## 2017-12-06 DIAGNOSIS — I119 Hypertensive heart disease without heart failure: Secondary | ICD-10-CM | POA: Diagnosis not present

## 2017-12-06 DIAGNOSIS — M1612 Unilateral primary osteoarthritis, left hip: Secondary | ICD-10-CM | POA: Insufficient documentation

## 2017-12-06 DIAGNOSIS — Z01818 Encounter for other preprocedural examination: Secondary | ICD-10-CM | POA: Diagnosis present

## 2017-12-06 DIAGNOSIS — Z01812 Encounter for preprocedural laboratory examination: Secondary | ICD-10-CM | POA: Insufficient documentation

## 2017-12-06 DIAGNOSIS — Z0183 Encounter for blood typing: Secondary | ICD-10-CM | POA: Insufficient documentation

## 2017-12-06 HISTORY — DX: Posterior reversible encephalopathy syndrome: I67.83

## 2017-12-06 HISTORY — DX: Unspecified osteoarthritis, unspecified site: M19.90

## 2017-12-06 LAB — BASIC METABOLIC PANEL
ANION GAP: 9 (ref 5–15)
BUN: 18 mg/dL (ref 6–20)
CALCIUM: 9.1 mg/dL (ref 8.9–10.3)
CO2: 23 mmol/L (ref 22–32)
Chloride: 108 mmol/L (ref 98–111)
Creatinine, Ser: 0.91 mg/dL (ref 0.61–1.24)
Glucose, Bld: 97 mg/dL (ref 70–99)
POTASSIUM: 3.4 mmol/L — AB (ref 3.5–5.1)
Sodium: 140 mmol/L (ref 135–145)

## 2017-12-06 LAB — CBC
HCT: 43.2 % (ref 39.0–52.0)
Hemoglobin: 14.9 g/dL (ref 13.0–17.0)
MCH: 30.3 pg (ref 26.0–34.0)
MCHC: 34.5 g/dL (ref 30.0–36.0)
MCV: 87.8 fL (ref 78.0–100.0)
Platelets: 218 10*3/uL (ref 150–400)
RBC: 4.92 MIL/uL (ref 4.22–5.81)
RDW: 13.6 % (ref 11.5–15.5)
WBC: 7.3 10*3/uL (ref 4.0–10.5)

## 2017-12-06 LAB — SURGICAL PCR SCREEN
MRSA, PCR: NEGATIVE
STAPHYLOCOCCUS AUREUS: POSITIVE — AB

## 2017-12-09 ENCOUNTER — Encounter (HOSPITAL_COMMUNITY)
Admission: RE | Disposition: A | Discharge status: Home Health | Payer: Self-pay | Source: Ambulatory Visit | Attending: Orthopaedic Surgery

## 2017-12-09 ENCOUNTER — Inpatient Hospital Stay (HOSPITAL_COMMUNITY): Payer: BLUE CROSS/BLUE SHIELD | Admitting: Anesthesiology

## 2017-12-09 ENCOUNTER — Inpatient Hospital Stay (HOSPITAL_COMMUNITY): Payer: BLUE CROSS/BLUE SHIELD

## 2017-12-09 ENCOUNTER — Other Ambulatory Visit: Payer: Self-pay

## 2017-12-09 ENCOUNTER — Inpatient Hospital Stay (HOSPITAL_COMMUNITY)
Admission: RE | Admit: 2017-12-09 | Discharge: 2017-12-11 | DRG: 470 | Disposition: A | Discharge status: Home Health | Payer: BLUE CROSS/BLUE SHIELD | Attending: Orthopaedic Surgery | Admitting: Orthopaedic Surgery

## 2017-12-09 ENCOUNTER — Encounter (HOSPITAL_COMMUNITY): Payer: Self-pay | Admitting: *Deleted

## 2017-12-09 DIAGNOSIS — Z6830 Body mass index (BMI) 30.0-30.9, adult: Secondary | ICD-10-CM

## 2017-12-09 DIAGNOSIS — M1612 Unilateral primary osteoarthritis, left hip: Secondary | ICD-10-CM | POA: Diagnosis present

## 2017-12-09 DIAGNOSIS — Z8673 Personal history of transient ischemic attack (TIA), and cerebral infarction without residual deficits: Secondary | ICD-10-CM | POA: Diagnosis not present

## 2017-12-09 DIAGNOSIS — E669 Obesity, unspecified: Secondary | ICD-10-CM | POA: Diagnosis present

## 2017-12-09 DIAGNOSIS — M1990 Unspecified osteoarthritis, unspecified site: Secondary | ICD-10-CM

## 2017-12-09 DIAGNOSIS — I10 Essential (primary) hypertension: Secondary | ICD-10-CM | POA: Diagnosis present

## 2017-12-09 DIAGNOSIS — Z823 Family history of stroke: Secondary | ICD-10-CM

## 2017-12-09 DIAGNOSIS — Z96642 Presence of left artificial hip joint: Secondary | ICD-10-CM

## 2017-12-09 HISTORY — PX: TOTAL HIP ARTHROPLASTY: SHX124

## 2017-12-09 SURGERY — ARTHROPLASTY, HIP, TOTAL, ANTERIOR APPROACH
Anesthesia: Spinal | Site: Hip | Laterality: Left

## 2017-12-09 MED ORDER — TRANEXAMIC ACID 1000 MG/10ML IV SOLN
INTRAVENOUS | Status: AC
  Filled 2017-12-09: qty 10

## 2017-12-09 MED ORDER — MENTHOL 3 MG MT LOZG: 1.0000 | LOZENGE | OROMUCOSAL | Status: DC | PRN

## 2017-12-09 MED ORDER — HYDROMORPHONE HCL 1 MG/ML IJ SOLN: 1.0000 mg | INTRAMUSCULAR | Status: DC | PRN

## 2017-12-09 MED ORDER — STERILE WATER FOR IRRIGATION IR SOLN
Status: DC | PRN
  Administered 2017-12-09: 2000 mL

## 2017-12-09 MED ORDER — KETOROLAC TROMETHAMINE 15 MG/ML IJ SOLN
15.0000 mg | Freq: Four times a day (QID) | INTRAMUSCULAR | Status: AC
  Administered 2017-12-09 – 2017-12-10 (×3): 15 mg via INTRAVENOUS
  Filled 2017-12-09 (×3): qty 1

## 2017-12-09 MED ORDER — PROPOFOL 10 MG/ML IV BOLUS
INTRAVENOUS | Status: AC
Start: 2017-12-09 — End: ?
  Filled 2017-12-09: qty 20

## 2017-12-09 MED ORDER — METOCLOPRAMIDE HCL 5 MG PO TABS: 5.0000 mg | ORAL_TABLET | Freq: Three times a day (TID) | ORAL | Status: DC | PRN

## 2017-12-09 MED ORDER — SODIUM CHLORIDE 0.9 % IV SOLN
INTRAVENOUS | Status: DC | PRN
  Administered 2017-12-09: 40 ug/min via INTRAVENOUS

## 2017-12-09 MED ORDER — ONDANSETRON HCL 4 MG/2ML IJ SOLN
INTRAMUSCULAR | Status: AC
  Filled 2017-12-09: qty 2

## 2017-12-09 MED ORDER — PROPOFOL 10 MG/ML IV BOLUS
INTRAVENOUS | Status: AC
  Filled 2017-12-09: qty 20

## 2017-12-09 MED ORDER — EPHEDRINE 5 MG/ML INJ
INTRAVENOUS | Status: AC
  Filled 2017-12-09: qty 10

## 2017-12-09 MED ORDER — ZOLPIDEM TARTRATE 5 MG PO TABS: 5.0000 mg | ORAL_TABLET | Freq: Every evening | ORAL | Status: DC | PRN

## 2017-12-09 MED ORDER — METHOCARBAMOL 500 MG PO TABS
500.0000 mg | ORAL_TABLET | Freq: Four times a day (QID) | ORAL | Status: DC | PRN
Start: 2017-12-09 — End: 2017-12-11
  Administered 2017-12-09 – 2017-12-10 (×2): 500 mg via ORAL
  Filled 2017-12-09 (×2): qty 1

## 2017-12-09 MED ORDER — 0.9 % SODIUM CHLORIDE (POUR BTL) OPTIME
TOPICAL | Status: DC | PRN
  Administered 2017-12-09: 1000 mL

## 2017-12-09 MED ORDER — PHENYLEPHRINE HCL 10 MG/ML IJ SOLN
INTRAMUSCULAR | Status: AC
  Filled 2017-12-09: qty 1

## 2017-12-09 MED ORDER — PANTOPRAZOLE SODIUM 40 MG PO TBEC
40.0000 mg | DELAYED_RELEASE_TABLET | Freq: Every day | ORAL | Status: DC
  Administered 2017-12-09 – 2017-12-11 (×3): 40 mg via ORAL
  Filled 2017-12-09 (×3): qty 1

## 2017-12-09 MED ORDER — FENTANYL CITRATE (PF) 100 MCG/2ML IJ SOLN: 25.0000 ug | INTRAMUSCULAR | Status: DC | PRN

## 2017-12-09 MED ORDER — ONDANSETRON HCL 4 MG/2ML IJ SOLN: 4.0000 mg | Freq: Four times a day (QID) | INTRAMUSCULAR | Status: DC | PRN

## 2017-12-09 MED ORDER — HYDROCODONE-ACETAMINOPHEN 7.5-325 MG PO TABS
1.0000 | ORAL_TABLET | ORAL | Status: DC | PRN
  Administered 2017-12-09 – 2017-12-11 (×2): 1 via ORAL
  Filled 2017-12-09 (×2): qty 1

## 2017-12-09 MED ORDER — DIPHENHYDRAMINE HCL 12.5 MG/5ML PO ELIX: 12.5000 mg | ORAL_SOLUTION | ORAL | Status: DC | PRN

## 2017-12-09 MED ORDER — AMLODIPINE BESYLATE 10 MG PO TABS
10.0000 mg | ORAL_TABLET | Freq: Every day | ORAL | Status: DC
  Administered 2017-12-09 – 2017-12-11 (×3): 10 mg via ORAL
  Filled 2017-12-09 (×3): qty 1

## 2017-12-09 MED ORDER — PROPOFOL 500 MG/50ML IV EMUL
INTRAVENOUS | Status: DC | PRN
  Administered 2017-12-09: 75 ug/kg/min via INTRAVENOUS

## 2017-12-09 MED ORDER — ONDANSETRON HCL 4 MG PO TABS: 4.0000 mg | ORAL_TABLET | Freq: Four times a day (QID) | ORAL | Status: DC | PRN

## 2017-12-09 MED ORDER — PHENOL 1.4 % MT LIQD
1.0000 | OROMUCOSAL | Status: DC | PRN
  Filled 2017-12-09: qty 177

## 2017-12-09 MED ORDER — FENTANYL CITRATE (PF) 100 MCG/2ML IJ SOLN
INTRAMUSCULAR | Status: DC | PRN
  Administered 2017-12-09: 25 ug via INTRAVENOUS

## 2017-12-09 MED ORDER — MORPHINE SULFATE (PF) 2 MG/ML IV SOLN: 0.5000 mg | INTRAVENOUS | Status: DC | PRN

## 2017-12-09 MED ORDER — MIDAZOLAM HCL 2 MG/2ML IJ SOLN
INTRAMUSCULAR | Status: AC
  Filled 2017-12-09: qty 2

## 2017-12-09 MED ORDER — TRANEXAMIC ACID 1000 MG/10ML IV SOLN
1000.0000 mg | INTRAVENOUS | Status: AC
  Administered 2017-12-09: 1000 mg via INTRAVENOUS

## 2017-12-09 MED ORDER — DEXAMETHASONE SODIUM PHOSPHATE 10 MG/ML IJ SOLN
INTRAMUSCULAR | Status: AC
  Filled 2017-12-09: qty 1

## 2017-12-09 MED ORDER — ONDANSETRON HCL 4 MG/2ML IJ SOLN: 4.0000 mg | Freq: Once | INTRAMUSCULAR | Status: DC | PRN

## 2017-12-09 MED ORDER — METOPROLOL SUCCINATE ER 50 MG PO TB24
50.0000 mg | ORAL_TABLET | Freq: Every day | ORAL | Status: DC
  Administered 2017-12-10 – 2017-12-11 (×2): 50 mg via ORAL
  Filled 2017-12-09 (×2): qty 1

## 2017-12-09 MED ORDER — PROPOFOL 10 MG/ML IV BOLUS
INTRAVENOUS | Status: AC
  Filled 2017-12-09: qty 40

## 2017-12-09 MED ORDER — EPHEDRINE SULFATE-NACL 50-0.9 MG/10ML-% IV SOSY
PREFILLED_SYRINGE | INTRAVENOUS | Status: DC | PRN
  Administered 2017-12-09 (×2): 5 mg via INTRAVENOUS

## 2017-12-09 MED ORDER — CEFAZOLIN SODIUM-DEXTROSE 1-4 GM/50ML-% IV SOLN
1.0000 g | Freq: Four times a day (QID) | INTRAVENOUS | Status: AC
  Administered 2017-12-09 (×2): 1 g via INTRAVENOUS
  Filled 2017-12-09 (×2): qty 50

## 2017-12-09 MED ORDER — BUPIVACAINE IN DEXTROSE 0.75-8.25 % IT SOLN
INTRATHECAL | Status: DC | PRN
  Administered 2017-12-09: 2 mL via INTRATHECAL

## 2017-12-09 MED ORDER — MIDAZOLAM HCL 5 MG/5ML IJ SOLN
INTRAMUSCULAR | Status: DC | PRN
  Administered 2017-12-09: 2 mg via INTRAVENOUS

## 2017-12-09 MED ORDER — ACETAMINOPHEN 325 MG PO TABS: 325.0000 mg | ORAL_TABLET | Freq: Four times a day (QID) | ORAL | Status: DC | PRN

## 2017-12-09 MED ORDER — ONDANSETRON HCL 4 MG/2ML IJ SOLN
INTRAMUSCULAR | Status: DC | PRN
  Administered 2017-12-09: 4 mg via INTRAVENOUS

## 2017-12-09 MED ORDER — SODIUM CHLORIDE 0.9 % IR SOLN
Status: DC | PRN
  Administered 2017-12-09: 1000 mL

## 2017-12-09 MED ORDER — CHLORHEXIDINE GLUCONATE 4 % EX LIQD: 60.0000 mL | Freq: Once | CUTANEOUS | Status: DC

## 2017-12-09 MED ORDER — LACTATED RINGERS IV SOLN
INTRAVENOUS | Status: DC
  Administered 2017-12-09 (×3): via INTRAVENOUS

## 2017-12-09 MED ORDER — FENTANYL CITRATE (PF) 100 MCG/2ML IJ SOLN
INTRAMUSCULAR | Status: AC
  Filled 2017-12-09: qty 2

## 2017-12-09 MED ORDER — HYDRALAZINE HCL 50 MG PO TABS
100.0000 mg | ORAL_TABLET | Freq: Two times a day (BID) | ORAL | Status: DC
  Administered 2017-12-09 – 2017-12-11 (×4): 100 mg via ORAL
  Filled 2017-12-09 (×4): qty 2

## 2017-12-09 MED ORDER — DEXAMETHASONE SODIUM PHOSPHATE 10 MG/ML IJ SOLN
INTRAMUSCULAR | Status: DC | PRN
  Administered 2017-12-09: 10 mg via INTRAVENOUS

## 2017-12-09 MED ORDER — PROPOFOL 10 MG/ML IV BOLUS
INTRAVENOUS | Status: DC | PRN
  Administered 2017-12-09: 30 mg via INTRAVENOUS

## 2017-12-09 MED ORDER — METHOCARBAMOL 1000 MG/10ML IJ SOLN
500.0000 mg | Freq: Four times a day (QID) | INTRAVENOUS | Status: DC | PRN
  Filled 2017-12-09: qty 5

## 2017-12-09 MED ORDER — POLYETHYLENE GLYCOL 3350 17 G PO PACK: 17.0000 g | PACK | Freq: Every day | ORAL | Status: DC | PRN

## 2017-12-09 MED ORDER — ALUM & MAG HYDROXIDE-SIMETH 200-200-20 MG/5ML PO SUSP: 30.0000 mL | ORAL | Status: DC | PRN

## 2017-12-09 MED ORDER — HYDROCODONE-ACETAMINOPHEN 5-325 MG PO TABS
1.0000 | ORAL_TABLET | ORAL | Status: DC | PRN
  Administered 2017-12-11: 1 via ORAL
  Filled 2017-12-09: qty 1

## 2017-12-09 MED ORDER — ASPIRIN 81 MG PO CHEW
81.0000 mg | CHEWABLE_TABLET | Freq: Two times a day (BID) | ORAL | Status: DC
  Administered 2017-12-09 – 2017-12-11 (×4): 81 mg via ORAL
  Filled 2017-12-09 (×4): qty 1

## 2017-12-09 MED ORDER — DOCUSATE SODIUM 100 MG PO CAPS
100.0000 mg | ORAL_CAPSULE | Freq: Two times a day (BID) | ORAL | Status: DC
  Administered 2017-12-09 – 2017-12-11 (×4): 100 mg via ORAL
  Filled 2017-12-09 (×4): qty 1

## 2017-12-09 MED ORDER — SODIUM CHLORIDE 0.9 % IV SOLN
INTRAVENOUS | Status: DC
  Administered 2017-12-09: 16:00:00 via INTRAVENOUS

## 2017-12-09 MED ORDER — CEFAZOLIN SODIUM-DEXTROSE 2-4 GM/100ML-% IV SOLN
2.0000 g | INTRAVENOUS | Status: AC
  Administered 2017-12-09: 2 g via INTRAVENOUS

## 2017-12-09 MED ORDER — CEFAZOLIN SODIUM-DEXTROSE 2-4 GM/100ML-% IV SOLN
INTRAVENOUS | Status: AC
  Filled 2017-12-09: qty 100

## 2017-12-09 MED ORDER — METOCLOPRAMIDE HCL 5 MG/ML IJ SOLN: 5.0000 mg | Freq: Three times a day (TID) | INTRAMUSCULAR | Status: DC | PRN

## 2017-12-09 SURGICAL SUPPLY — 36 items
APL SKNCLS STERI-STRIP NONHPOA (GAUZE/BANDAGES/DRESSINGS) ×1
BAG SPEC THK2 15X12 ZIP CLS (MISCELLANEOUS)
BAG ZIPLOCK 12X15 (MISCELLANEOUS) IMPLANT
BENZOIN TINCTURE PRP APPL 2/3 (GAUZE/BANDAGES/DRESSINGS) ×2 IMPLANT
BLADE SAW SGTL 18X1.27X75 (BLADE) ×2 IMPLANT
BLADE SAW SGTL 18X1.27X75MM (BLADE) ×1
CAPT HIP TOTAL 2 ×2 IMPLANT
CLOSURE WOUND 1/2 X4 (GAUZE/BANDAGES/DRESSINGS) ×1
COVER PERINEAL POST (MISCELLANEOUS) ×3 IMPLANT
COVER SURGICAL LIGHT HANDLE (MISCELLANEOUS) ×3 IMPLANT
DRAPE STERI IOBAN 125X83 (DRAPES) ×3 IMPLANT
DRAPE U-SHAPE 47X51 STRL (DRAPES) ×6 IMPLANT
DRSG AQUACEL AG ADV 3.5X10 (GAUZE/BANDAGES/DRESSINGS) ×3 IMPLANT
DURAPREP 26ML APPLICATOR (WOUND CARE) ×3 IMPLANT
ELECT REM PT RETURN 15FT ADLT (MISCELLANEOUS) ×3 IMPLANT
GAUZE XEROFORM 1X8 LF (GAUZE/BANDAGES/DRESSINGS) ×2 IMPLANT
GLOVE BIO SURGEON STRL SZ7.5 (GLOVE) ×3 IMPLANT
GLOVE BIOGEL PI IND STRL 8 (GLOVE) ×2 IMPLANT
GLOVE BIOGEL PI INDICATOR 8 (GLOVE) ×4
GLOVE ECLIPSE 8.0 STRL XLNG CF (GLOVE) ×3 IMPLANT
GOWN STRL REUS W/TWL XL LVL3 (GOWN DISPOSABLE) ×6 IMPLANT
HANDPIECE INTERPULSE COAX TIP (DISPOSABLE) ×3
HOLDER FOLEY CATH W/STRAP (MISCELLANEOUS) ×3 IMPLANT
PACK ANTERIOR HIP CUSTOM (KITS) ×3 IMPLANT
SET HNDPC FAN SPRY TIP SCT (DISPOSABLE) ×1 IMPLANT
STAPLER VISISTAT 35W (STAPLE) IMPLANT
STEM CORAIL KLA11 (Stem) ×2 IMPLANT
STRIP CLOSURE SKIN 1/2X4 (GAUZE/BANDAGES/DRESSINGS) ×1 IMPLANT
SUT ETHIBOND NAB CT1 #1 30IN (SUTURE) ×3 IMPLANT
SUT MNCRL AB 4-0 PS2 18 (SUTURE) IMPLANT
SUT VIC AB 0 CT1 36 (SUTURE) ×3 IMPLANT
SUT VIC AB 1 CT1 36 (SUTURE) ×3 IMPLANT
SUT VIC AB 2-0 CT1 27 (SUTURE) ×6
SUT VIC AB 2-0 CT1 TAPERPNT 27 (SUTURE) ×2 IMPLANT
TRAY FOLEY MTR SLVR 16FR STAT (SET/KITS/TRAYS/PACK) ×3 IMPLANT
YANKAUER SUCT BULB TIP 10FT TU (MISCELLANEOUS) ×3 IMPLANT

## 2017-12-09 NOTE — Brief Op Note (Signed)
12/09/2017  11:57 AM  PATIENT:  Sydney Inge  52 y.o. male  PRE-OPERATIVE DIAGNOSIS:  osteoarthritis left hip  POST-OPERATIVE DIAGNOSIS:  osteoarthritis left hip  PROCEDURE:  Procedure(s): LEFT TOTAL HIP ARTHROPLASTY ANTERIOR APPROACH (Left)  SURGEON:  Surgeon(s) and Role:    Kathryne Hitch, MD - Primary  PHYSICIAN ASSISTANT: Rexene Edison, PA-C assisted  ANESTHESIA:   spinal  EBL:  200 mL   COUNTS:  YES  DICTATION: .Other Dictation: Dictation Number [id number]  PLAN OF CARE: Admit to inpatient   PATIENT DISPOSITION:  PACU - hemodynamically stable.   Delay start of Pharmacological VTE agent (>24hrs) due to surgical blood loss or risk of bleeding: no

## 2017-12-09 NOTE — Transfer of Care (Signed)
Immediate Anesthesia Transfer of Care Note  Patient: Cameron Eaton  Procedure(s) Performed: Procedure(s): LEFT TOTAL HIP ARTHROPLASTY ANTERIOR APPROACH (Left)  Patient Location: PACU  Anesthesia Type:Spinal  Level of Consciousness:  sedated, patient cooperative and responds to stimulation  Airway & Oxygen Therapy:Patient Spontanous Breathing and Patient connected to face mask oxgen  Post-op Assessment:  Report given to PACU RN and Post -op Vital signs reviewed and stable  Post vital signs:  Reviewed and stable  Last Vitals:  Vitals:   12/09/17 0833  BP: (!) 147/101  Pulse: 78  Resp: 18  Temp: 36.6 C  SpO2: 96%    Complications: No apparent anesthesia complications

## 2017-12-09 NOTE — Anesthesia Postprocedure Evaluation (Signed)
Anesthesia Post Note  Patient: Cameron Eaton  Procedure(s) Performed: LEFT TOTAL HIP ARTHROPLASTY ANTERIOR APPROACH (Left Hip)     Patient location during evaluation: PACU Anesthesia Type: Spinal Level of consciousness: oriented and awake and alert Pain management: pain level controlled Vital Signs Assessment: post-procedure vital signs reviewed and stable Respiratory status: spontaneous breathing, respiratory function stable and patient connected to nasal cannula oxygen Cardiovascular status: blood pressure returned to baseline and stable Postop Assessment: no headache, no backache, no apparent nausea or vomiting and spinal receding Anesthetic complications: no    Last Vitals:  Vitals:   12/09/17 1400 12/09/17 1420  BP: (!) 125/96   Pulse: (!) 57   Resp: 14   Temp:  36.4 C  SpO2: 96%     Last Pain:  Vitals:   12/09/17 1420  TempSrc:   PainSc: 2     LLE Motor Response: Purposeful movement (12/09/17 1420) LLE Sensation: Numbness (12/09/17 1420) RLE Motor Response: Purposeful movement (12/09/17 1420) RLE Sensation: Numbness (12/09/17 1420) L Sensory Level: L2-Upper inner thigh, upper buttock (12/09/17 1420) R Sensory Level: L2-Upper inner thigh, upper buttock (12/09/17 1420)  Ryan P Ellender

## 2017-12-09 NOTE — H&P (Signed)
TOTAL HIP ADMISSION H&P  Patient is admitted for left total hip arthroplasty.  Subjective:  Chief Complaint: left hip pain  HPI: Cameron Eaton, 52 y.o. male, has a history of pain and functional disability in the left hip(s) due to arthritis and patient has failed non-surgical conservative treatments for greater than 12 weeks to include NSAID's and/or analgesics, corticosteriod injections, flexibility and strengthening excercises, use of assistive devices, weight reduction as appropriate and activity modification.  Onset of symptoms was gradual starting 4 years ago with gradually worsening course since that time.The patient noted no past surgery on the left hip(s).  Patient currently rates pain in the left hip at 10 out of 10 with activity. Patient has night pain, worsening of pain with activity and weight bearing, trendelenberg gait, pain that interfers with activities of daily living, pain with passive range of motion and crepitus. Patient has evidence of subchondral cysts, subchondral sclerosis, periarticular osteophytes and joint space narrowing by imaging studies. This condition presents safety issues increasing the risk of falls.  There is no current active infection.  Patient Active Problem List   Diagnosis Date Noted  . Unilateral primary osteoarthritis, left hip 11/14/2017  . PRES (posterior reversible encephalopathy syndrome) 02/28/2013  . Family history of coronary artery disease 11/24/2012  . Abnormal EKG- LVH 11/24/2012  . 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 Hypertensive urgency 11/17/2012  . Headache on admission 11/17/2012  . Hypertension    Past Medical History:  Diagnosis Date  . Arthritis   . CVA (cerebral infarction) 11/18/2012   Acute Rt brain by MRI  . Hypertension   . Posterior reversible encephalopathy syndrome (PRES)    neuro visit lov 02-28-13 , sx resolved     Past Surgical History:  Procedure Laterality Date  .  COLONOSCOPY      Current Facility-Administered Medications  Medication Dose Route Frequency Provider Last Rate Last Dose  . ceFAZolin (ANCEF) 2-4 GM/100ML-% IVPB           . sodium chloride 0.9 % with tranexamic acid (CYKLOKAPRON) ADS Med           . ceFAZolin (ANCEF) IVPB 2g/100 mL premix  2 g Intravenous On Call to OR Kirtland Bouchard, PA-C      . chlorhexidine (HIBICLENS) 4 % liquid 4 application  60 mL Topical Once Richardean Canal W, PA-C      . lactated ringers infusion   Intravenous Continuous Ellender, Catheryn Bacon, MD      . tranexamic acid (CYKLOKAPRON) 1,000 mg in sodium chloride 0.9 % 100 mL IVPB  1,000 mg Intravenous To OR Kirtland Bouchard, PA-C       Allergies  Allergen Reactions  . Percocet [Oxycodone-Acetaminophen] Other (See Comments)    Delusional, irrational    Social History   Tobacco Use  . Smoking status: Never Smoker  . Smokeless tobacco: Never Used  Substance Use Topics  . Alcohol use: Yes    Comment: occasional    Family History  Problem Relation Age of Onset  . CVA Father   . Coronary artery disease Brother 44       MI treated with PCI x 2     Review of Systems  Musculoskeletal: Positive for joint pain.  All other systems reviewed and are negative.   Objective:  Physical Exam  Constitutional: He is oriented to person, place, and time. He appears well-developed and well-nourished.  HENT:  Head: Normocephalic and atraumatic.  Eyes: Pupils are  equal, round, and reactive to light. EOM are normal.  Neck: Normal range of motion. Neck supple.  Cardiovascular: Normal rate and regular rhythm.  Respiratory: Effort normal and breath sounds normal.  GI: Soft. Bowel sounds are normal.  Musculoskeletal:       Left hip: He exhibits decreased range of motion, decreased strength, tenderness and bony tenderness.  Neurological: He is alert and oriented to person, place, and time.  Skin: Skin is warm and dry.  Psychiatric: He has a normal mood and affect.    Vital  signs in last 24 hours: Temp:  [97.9 F (36.6 C)] 97.9 F (36.6 C) (07/26 0833) Pulse Rate:  [78] 78 (07/26 0833) Resp:  [18] 18 (07/26 0833) BP: (147)/(101) 147/101 (07/26 0833) SpO2:  [96 %] 96 % (07/26 0833)  Labs:   Estimated body mass index is 30.27 kg/m as calculated from the following:   Height as of 12/06/17: 5\' 11"  (1.803 m).   Weight as of 12/06/17: 217 lb (98.4 kg).   Imaging Review Plain radiographs demonstrate severe degenerative joint disease of the left hip(s). The bone quality appears to be excellent for age and reported activity level.    Preoperative templating of the joint replacement has been completed, documented, and submitted to the Operating Room personnel in order to optimize intra-operative equipment management.     Assessment/Plan:  End stage arthritis, left hip(s)  The patient history, physical examination, clinical judgement of the provider and imaging studies are consistent with end stage degenerative joint disease of the left hip(s) and total hip arthroplasty is deemed medically necessary. The treatment options including medical management, injection therapy, arthroscopy and arthroplasty were discussed at length. The risks and benefits of total hip arthroplasty were presented and reviewed. The risks due to aseptic loosening, infection, stiffness, dislocation/subluxation,  thromboembolic complications and other imponderables were discussed.  The patient acknowledged the explanation, agreed to proceed with the plan and consent was signed. Patient is being admitted for inpatient treatment for surgery, pain control, PT, OT, prophylactic antibiotics, VTE prophylaxis, progressive ambulation and ADL's and discharge planning.The patient is planning to be discharged home with home health services

## 2017-12-09 NOTE — Op Note (Signed)
NAMEZyler, Hyson [facility] MEDICAL RECORD ON:[id number] ACCOUNT [id number] DATE OF BIRTH:04-14-1966 FACILITY: [facility] LOCATION: [facility] PHYSICIAN:Kashena Novitski Aretha Parrot, MD  OPERATIVE REPORT  DATE OF PROCEDURE:  12/09/2017  PREOPERATIVE DIAGNOSIS:  Severe osteoarthritis and degenerative joint disease, left hip.  POSTOPERATIVE DIAGNOSIS:  Severe osteoarthritis and degenerative joint disease, left hip.  PROCEDURE:  Left total hip arthroplasty, direct anterior approach.  IMPLANTS:  DePuy Sector Gription acetabular component size 54, size 36+0 neutral polyethylene liner, size 11 Corail femoral component with varus offset, size 36+1.5 ceramic hip ball.  SURGEON:  Vanita Panda. Magnus Ivan, MD  ASSISTANT:  Richardean Canal, PA-C.  ANESTHESIA:  Spinal.  ANTIBIOTICS:  Two grams IV Ancef.  ESTIMATED BLOOD LOSS:  200 mL.  COMPLICATIONS:  None.  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  He has been having debilitating left hip pain for over 5 years now.  He had x-rays showing severe arthritis in that hip and it has gotten to where  it is detrimentally affecting his activities of daily living, his mobility and his quality of life.  At this point, he does wish to proceed with a total hip arthroplasty.  His x-rays do show severe arthritis of the left hip.  He understands with the  surgery, the risk of acute blood loss anemia, nerve or vessel injury, fracture, infection, dislocation, DVT, implant failure and skin issues.  He understands our goals are to decrease pain, improve mobility and overall improve quality of life.  DESCRIPTION OF PROCEDURE:  After informed consent was obtained and appropriate left hip was marked.  He was brought to the operating room and sat up on a stretcher where spinal anesthesia was obtained.  He was then laid in the supine position on the  stretcher.  Foley catheter was placed.  I assessed his leg lengths and  found the leg lengths to be equal preoperative.  I then placed traction boots on both his feet and placed him supine on the Hana fracture table with a perineal post in place and both  legs in line skeletal traction device but no traction applied.  His left operative hip was prepped and draped with DuraPrep and sterile drapes.  A time-out was called and he was identified by correct patient and correct left hip.  We then made an  incision just inferior and posterior to the anterior superior iliac spine and carried this obliquely down the leg.  We dissected down the tensor fascia lata and tensor fascia was then divided longitudinally to proceed with direct anterior approach to the  hip.  We identified and cauterized circumflex vessels and identified the hip capsule.  We opened the hip capsule in L-type format, finding moderate joint effusion and significant arthritis around his left hip.  I then placed a Cobra retractor around the  medial and lateral femoral neck and made our femoral neck cut with an oscillating saw and completed this with an osteotome.  We placed a corkscrew guide in the femoral head and removed the femoral head in its entirety and found it to be completely  devoid of cartilage.  I then placed a bent Hohmann over the medial acetabular rim and removed remnants of acetabular labrum and other debris.  We then began reaming under direct visualization from a size 44 reamer going in stepwise increments up to a  size 53 with all reamers under direct visualization, the last reamer under direct fluoroscopy, so we could obtain our depth in  reaming, our inclination and anteversion.  We then placed the real DePuy Sector Gription acetabular component size 54 and a  36+0 neutral polyethylene liner for that size acetabular component.  Attention was turned to the femur.  With the leg externally rotated to 120 degrees, extended and adducted, we were able to place a Mueller retractor medially and Homans  retractor above  the greater trochanter, released lateral joint capsule and used a box-cutting osteotome to enter femoral canal and rongeur to lateralize.  We then began broaching from a size 8 broach using Corail broaching system going up to a size 11.  With a size 11  in place, we trialed a varus offset femoral neck based on his anatomy at 36+1.5 hip ball lordosis.  We brought the leg back over and up reducing the pelvis and we were pleased with leg length, offset, range of motion and stability.  We then dislocated  the hip and removed the trial components.  We then placed the real Corail femoral component size 11 with a varus offset and the real 36+1.5 ceramic hip ball, again reduced the acetabulum.  We were pleased with stability.  We assessed again under  fluoroscopy as well.  We then irrigated the soft tissues with normal saline solution using pulsatile lavage.  We closed joint capsule with interrupted #1 Ethibond suture, followed by running 0 Vicryl in tensor fascia, 0 Vicryl in the deep tissue, 2-0  Vicryl subcutaneous tissue, 4-0 Monocryl subcuticular stitch and Steri-Strips on the skin.  Aquacel dressing was applied.  He was taken off the Hana table and taken to recovery room in stable condition.  All final counts were correct.  There were no  complications noted.  Of note, Rexene Edison, PA-C, assisted in the entire case.  His assistance was crucial for facilitating all aspects of this case.  TN/NUANCE  D:12/09/2017 T:12/09/2017 JOB:[id number]

## 2017-12-09 NOTE — Plan of Care (Signed)
Started discussing plan of care with patient and family

## 2017-12-09 NOTE — Anesthesia Procedure Notes (Signed)
Spinal  Patient location during procedure: OR Start time: 12/09/2017 10:30 AM End time: 12/09/2017 10:37 AM Reason for block: at surgeon's request Staffing Resident/CRNA: Anne Fu, CRNA Performed: resident/CRNA  Preanesthetic Checklist Completed: patient identified, site marked, surgical consent, pre-op evaluation, timeout performed, IV checked, risks and benefits discussed and monitors and equipment checked Spinal Block Patient position: sitting Prep: DuraPrep Patient monitoring: heart rate, continuous pulse ox and blood pressure Approach: midline Location: L2-3 Injection technique: single-shot Needle Needle type: Pencan  Needle gauge: 24 G Needle length: 9 cm Assessment Sensory level: T6 Additional Notes Expiration date of kit checked and confirmed. Patient tolerated procedure well, without complications. X 2 attempts with noted clear CSF return.  First attempt right paramedian without success, converted to midline.  Loss of motor and sensory on exam post injection.

## 2017-12-09 NOTE — Anesthesia Preprocedure Evaluation (Addendum)
Anesthesia Evaluation  Patient identified by MRN, date of birth, ID band Patient awake    Reviewed: Allergy & Precautions, NPO status , Patient's Chart, lab work & pertinent test results, reviewed documented beta blocker date and time   Airway Mallampati: II  TM Distance: >3 FB Neck ROM: Full    Dental no notable dental hx.    Pulmonary neg pulmonary ROS,    Pulmonary exam normal breath sounds clear to auscultation       Cardiovascular hypertension, Pt. on medications and Pt. on home beta blockers Normal cardiovascular exam Rhythm:Regular Rate:Normal  ECG: SR, rate 62, LAD, LVH   Neuro/Psych CVA, No Residual Symptoms negative psych ROS   GI/Hepatic negative GI ROS, Neg liver ROS,   Endo/Other  negative endocrine ROS  Renal/GU negative Renal ROS     Musculoskeletal  (+) Arthritis , Osteoarthritis,    Abdominal (+) + obese,   Peds  Hematology negative hematology ROS (+)   Anesthesia Other Findings Osteoarthritis left hip  Reproductive/Obstetrics                            Anesthesia Physical Anesthesia Plan  ASA: II  Anesthesia Plan: Spinal   Post-op Pain Management:    Induction: Intravenous  PONV Risk Score and Plan: 1 and Ondansetron and Treatment may vary due to age or medical condition  Airway Management Planned: Nasal Cannula  Additional Equipment:   Intra-op Plan:   Post-operative Plan:   Informed Consent: I have reviewed the patients History and Physical, chart, labs and discussed the procedure including the risks, benefits and alternatives for the proposed anesthesia with the patient or authorized representative who has indicated his/her understanding and acceptance.   Dental advisory given  Plan Discussed with: CRNA  Anesthesia Plan Comments:         Anesthesia Quick Evaluation

## 2017-12-10 LAB — CBC
HCT: 37.1 % — ABNORMAL LOW (ref 39.0–52.0)
Hemoglobin: 12.8 g/dL — ABNORMAL LOW (ref 13.0–17.0)
MCH: 30.4 pg (ref 26.0–34.0)
MCHC: 34.5 g/dL (ref 30.0–36.0)
MCV: 88.1 fL (ref 78.0–100.0)
PLATELETS: 198 10*3/uL (ref 150–400)
RBC: 4.21 MIL/uL — ABNORMAL LOW (ref 4.22–5.81)
RDW: 13.2 % (ref 11.5–15.5)
WBC: 15.3 10*3/uL — ABNORMAL HIGH (ref 4.0–10.5)

## 2017-12-10 LAB — BASIC METABOLIC PANEL
Anion gap: 5 (ref 5–15)
BUN: 21 mg/dL — AB (ref 6–20)
CALCIUM: 8.5 mg/dL — AB (ref 8.9–10.3)
CO2: 25 mmol/L (ref 22–32)
Chloride: 110 mmol/L (ref 98–111)
Creatinine, Ser: 0.85 mg/dL (ref 0.61–1.24)
GFR calc Af Amer: >60 mL/min (ref >60)
GLUCOSE: 136 mg/dL — AB (ref 70–99)
POTASSIUM: 3.9 mmol/L (ref 3.5–5.1)
Sodium: 140 mmol/L (ref 135–145)

## 2017-12-10 MED ORDER — ASPIRIN 81 MG PO CHEW: 81.0000 mg | CHEWABLE_TABLET | Freq: Two times a day (BID) | ORAL | 0 refills | Status: AC

## 2017-12-10 MED ORDER — HYDROMORPHONE HCL 1 MG/ML IJ SOLN
1.0000 mg | INTRAMUSCULAR | Status: DC | PRN
  Administered 2017-12-10: 1 mg via INTRAVENOUS
  Filled 2017-12-10: qty 1

## 2017-12-10 MED ORDER — METHOCARBAMOL 500 MG PO TABS: 500.0000 mg | ORAL_TABLET | Freq: Four times a day (QID) | ORAL | 0 refills | Status: DC | PRN

## 2017-12-10 MED ORDER — HYDROCODONE-ACETAMINOPHEN 7.5-325 MG PO TABS: 1.0000 | ORAL_TABLET | ORAL | 0 refills | Status: AC | PRN

## 2017-12-10 NOTE — Discharge Instructions (Signed)

## 2017-12-10 NOTE — Evaluation (Signed)
Occupational Therapy Evaluation Patient Details Name: Cameron Eaton MRN: [id number] DOB: November 02, 1965 Today's Date: 12/10/2017    History of Present Illness s/p L hip   Clinical Impression   Pt is s/p THA resulting in the deficits listed below (see OT Problem List).  Pt will benefit from skilled OT to increase their safety and independence with ADL and functional mobility for ADL to facilitate discharge to venue listed below.        Follow Up Recommendations  No OT follow up    Equipment Recommendations  None recommended by OT    Recommendations for Other Services       Precautions / Restrictions Precautions Precautions: Fall Restrictions Weight Bearing Restrictions: No Other Position/Activity Restrictions: WBAT      Mobility Bed Mobility Overal bed mobility: Independent             General bed mobility comments: Vc for sequencing  Transfers Overall transfer level: Needs assistance Equipment used: Rolling walker (2 wheeled) Transfers: Sit to/from Stand Sit to Stand: Min guard         General transfer comment: cues for hand placement and LLE position        ADL either performed or assessed with clinical judgement   ADL Overall ADL's : Needs assistance/impaired Eating/Feeding: Set up;Sitting   Grooming: Set up;Sitting   Upper Body Bathing: Set up;Sitting   Lower Body Bathing: Moderate assistance;Sit to/from stand;Cueing for sequencing;Cueing for safety   Upper Body Dressing : Set up;Sitting   Lower Body Dressing: Moderate assistance;Sit to/from stand;Cueing for sequencing   Toilet Transfer: Minimal assistance;RW;Comfort height toilet   Toileting- Clothing Manipulation and Hygiene: Minimal assistance;Sit to/from stand                            Pertinent Vitals/Pain Pain Assessment: 0-10 Pain Score: 3  Pain Location: left hip Pain Descriptors / Indicators: Sore;Tightness Pain Intervention(s): Monitored during session;Limited  activity within patient's tolerance     Hand Dominance     Extremity/Trunk Assessment Upper Extremity Assessment Upper Extremity Assessment: Overall WFL for tasks assessed   Lower Extremity Assessment Lower Extremity Assessment: LLE deficits/detail LLE Deficits / Details: grossly 3/5; limited by anticipated post op pain and weakness       Communication Communication Communication: No difficulties   Cognition Arousal/Alertness: Awake/alert Behavior During Therapy: WFL for tasks assessed/performed Overall Cognitive Status: Within Functional Limits for tasks assessed                                           Shoulder Instructions      Home Living Family/patient expects to be discharged to:: Private residence Living Arrangements: Alone   Type of Home: House Home Access: Stairs to enter [employer] of Steps: 1   Home Layout: Two level;Able to live on main level with bedroom/bathroom               Home Equipment: Gilmer Mor - single point          Prior Functioning/Environment Level of Independence: Independent                 OT Problem List: Decreased activity tolerance;Decreased knowledge of use of DME or AE      OT Treatment/Interventions: Self-care/ADL training;Patient/family education;Therapeutic activities    OT Goals(Current goals can be found in the care plan section) Acute  Rehab OT Goals Patient Stated Goal: home tomorrow OT Goal Formulation: With patient ADL Goals Pt Will Perform Lower Body Dressing: with modified independence;sit to/from stand Pt Will Transfer to Toilet: with modified independence;ambulating Pt Will Perform Toileting - Clothing Manipulation and hygiene: with modified independence;sit to/from stand Pt Will Perform Tub/Shower Transfer: Tub transfer;rolling walker  OT Frequency: Min 2X/week   Barriers to D/C:               AM-PAC PT "6 Clicks" Daily Activity     Outcome Measure Help from another  person eating meals?: None Help from another person taking care of personal grooming?: None Help from another person toileting, which includes using toliet, bedpan, or urinal?: A Little Help from another person bathing (including washing, rinsing, drying)?: A Little Help from another person to put on and taking off regular upper body clothing?: A Little Help from another person to put on and taking off regular lower body clothing?: A Little 6 Click Score: 20   End of Session Nurse Communication: Mobility status  Activity Tolerance: Patient tolerated treatment well Patient left: in chair  OT Visit Diagnosis: Unsteadiness on feet (R26.81);Other abnormalities of gait and mobility (R26.89)                Time: 1610-9604 OT Time Calculation (min): 20 min Charges:  OT General Charges $OT Visit: 1 Visit OT Evaluation $OT Eval Low Complexity: 1 Low  12/10/2017     Marilyn Nihiser, Karin Golden D 12/10/2017, 1:03 PM

## 2017-12-10 NOTE — Progress Notes (Signed)
Physical Therapy Treatment Patient Details Name: Cameron Eaton MRN: [id number] DOB: 03/23/1966 Today's Date: 12/10/2017    History of Present Illness s/p L hip    PT Comments    Progressing well; motivated and willing to work with PT, should be ready to d/c after PT tomorrow  Follow Up Recommendations  Home health PT     Equipment Recommendations  Rolling walker with 5" wheels;3in1 (PT)    Recommendations for Other Services       Precautions / Restrictions Precautions Precautions: Fall Restrictions Weight Bearing Restrictions: No Other Position/Activity Restrictions: WBAT    Mobility  Bed Mobility Overal bed mobility: Needs Assistance Bed Mobility: Sit to Supine       Sit to supine: Min assist   General bed mobility comments: assist to bring LLE onto bed   Transfers Overall transfer level: Needs assistance Equipment used: Rolling walker (2 wheeled) Transfers: Sit to/from Stand Sit to Stand: Supervision         General transfer comment: cues for hand placement and LLE position  Ambulation/Gait Ambulation/Gait assistance: Supervision;Min guard Gait Distance (Feet): 140 Feet Assistive device: Rolling walker (2 wheeled) Gait Pattern/deviations: Step-through pattern;Step-to pattern;Decreased weight shift to left     General Gait Details: cues for sequence, gait progression   Stairs             Wheelchair Mobility    Modified Rankin (Stroke Patients Only)       Balance                                            Cognition Arousal/Alertness: Awake/alert Behavior During Therapy: WFL for tasks assessed/performed Overall Cognitive Status: Within Functional Limits for tasks assessed                                        Exercises Total Joint Exercises Heel Slides: AROM;Left;10 reps Hip ABduction/ADduction: AROM;Left;10 reps Long Arc Quad: AROM;Strengthening;15 reps;Seated;Left    General Comments         Pertinent Vitals/Pain Pain Assessment: 0-10 Pain Score: 3  Pain Location: left hip Pain Descriptors / Indicators: Sore;Tightness Pain Intervention(s): Monitored during session;Repositioned    Home Living Family/patient expects to be discharged to:: Private residence Living Arrangements: Alone   Type of Home: House Home Access: Stairs to enter   Home Layout: Two level;Able to live on main level with bedroom/bathroom Home Equipment: Gilmer Mor - single point      Prior Function Level of Independence: Independent          PT Goals (current goals can now be found in the care plan section) Acute Rehab PT Goals Patient Stated Goal: home tomorrow PT Goal Formulation: With patient Time For Goal Achievement: 12/17/17 Potential to Achieve Goals: Good Progress towards PT goals: Progressing toward goals    Frequency    7X/week      PT Plan Current plan remains appropriate    Co-evaluation              AM-PAC PT "6 Clicks" Daily Activity  Outcome Measure  Difficulty turning over in bed (including adjusting bedclothes, sheets and blankets)?: A Lot Difficulty moving from lying on back to sitting on the side of the bed? : A Little Difficulty sitting down on and standing up from a chair with  arms (e.g., wheelchair, bedside commode, etc,.)?: A Little Help needed moving to and from a bed to chair (including a wheelchair)?: A Little Help needed walking in hospital room?: A Little Help needed climbing 3-5 steps with a railing? : A Little 6 Click Score: 17    End of Session Equipment Utilized During Treatment: Gait belt Activity Tolerance: Patient tolerated treatment well Patient left: in bed;with call bell/phone within reach;with family/visitor present   PT Visit Diagnosis: Difficulty in walking, not elsewhere classified (R26.2)     Time: 3419-3790 PT Time Calculation (min) (ACUTE ONLY): 23 min  Charges:  $Gait Training: 8-22 mins $Therapeutic Exercise: 8-22  mins                     Drucilla Chalet, PT Pager: [phone] 12/10/2017'   Drucilla Chalet 12/10/2017, 3:58 PM

## 2017-12-10 NOTE — Evaluation (Signed)
Physical Therapy Evaluation Patient Details Name: Cameron Eaton MRN: [id number] DOB: [birth year] Today's Date: 12/10/2017   History of Present Illness  s/p L hip  Clinical Impression  Pt admitted with above diagnosis. Pt currently with functional limitations due to the deficits listed below (see PT Problem List).  Pt doing very well, very motivated-- pain controlled, amb 100' with RW and supervision, initiated HEP; will see again this pm; Pt will benefit from skilled PT to increase their independence and safety with mobility to allow discharge to the venue listed below.       Follow Up Recommendations Home health PT    Equipment Recommendations  Rolling walker with 5" wheels;3in1 (PT)    Recommendations for Other Services       Precautions / Restrictions Precautions Precautions: Fall Restrictions Weight Bearing Restrictions: No Other Position/Activity Restrictions: WBAT      Mobility  Bed Mobility               General bed mobility comments: in chair  Transfers Overall transfer level: Needs assistance Equipment used: Rolling walker (2 wheeled) Transfers: Sit to/from Stand Sit to Stand: Min guard         General transfer comment: cues for hand placement and LLE position  Ambulation/Gait Ambulation/Gait assistance: Supervision;Min guard Gait Distance (Feet): 100 Feet Assistive device: Rolling walker (2 wheeled) Gait Pattern/deviations: Step-through pattern;Step-to pattern;Decreased weight shift to left     General Gait Details: cues for sequence, gait progression  Stairs            Wheelchair Mobility    Modified Rankin (Stroke Patients Only)       Balance                                             Pertinent Vitals/Pain Pain Assessment: 0-10 Pain Score: 3  Pain Location: left hip Pain Descriptors / Indicators: Sore;Tightness Pain Intervention(s): Monitored during session;Limited activity within patient's tolerance     Home Living Family/patient expects to be discharged to:: Private residence Living Arrangements: Alone   Type of Home: House Home Access: Stairs to enter   Secretary/administrator of Steps: 1 Home Layout: Two level;Able to live on main level with bedroom/bathroom Home Equipment: Gilmer Mor - single point      Prior Function Level of Independence: Independent               Hand Dominance        Extremity/Trunk Assessment   Upper Extremity Assessment Upper Extremity Assessment: Defer to OT evaluation    Lower Extremity Assessment Lower Extremity Assessment: LLE deficits/detail LLE Deficits / Details: grossly 3/5; limited by anticipated post op pain and weakness       Communication   Communication: No difficulties  Cognition Arousal/Alertness: Awake/alert Behavior During Therapy: WFL for tasks assessed/performed Overall Cognitive Status: Within Functional Limits for tasks assessed                                        General Comments      Exercises Total Joint Exercises Ankle Circles/Pumps: AROM;Both Quad Sets: AROM;Both Heel Slides: AROM;Left;10 reps   Assessment/Plan    PT Assessment Patient needs continued PT services  PT Problem List Decreased strength;Decreased activity tolerance;Decreased mobility;Decreased knowledge of use of DME;Pain  PT Treatment Interventions DME instruction;Gait training;Functional mobility training;Therapeutic activities;Therapeutic exercise;Patient/family education;Stair training    PT Goals (Current goals can be found in the Care Plan section)  Acute Rehab PT Goals Patient Stated Goal: home tomorrow PT Goal Formulation: With patient Time For Goal Achievement: 12/17/17 Potential to Achieve Goals: Good    Frequency 7X/week   Barriers to discharge        Co-evaluation               AM-PAC PT "6 Clicks" Daily Activity  Outcome Measure Difficulty turning over in bed (including adjusting  bedclothes, sheets and blankets)?: A Lot Difficulty moving from lying on back to sitting on the side of the bed? : A Little Difficulty sitting down on and standing up from a chair with arms (e.g., wheelchair, bedside commode, etc,.)?: A Little Help needed moving to and from a bed to chair (including a wheelchair)?: A Little Help needed walking in hospital room?: A Little Help needed climbing 3-5 steps with a railing? : A Little 6 Click Score: 17    End of Session Equipment Utilized During Treatment: Gait belt Activity Tolerance: Patient tolerated treatment well Patient left: with call bell/phone within reach;in chair;with family/visitor present   PT Visit Diagnosis: Difficulty in walking, not elsewhere classified (R26.2)    Time: 1610-9604 PT Time Calculation (min) (ACUTE ONLY): 26 min   Charges:   PT Evaluation $PT Eval Low Complexity: 1 Low PT Treatments $Gait Training: 8-22 mins        Drucilla Chalet, PT Pager: [phone] 12/10/2017   [facility] 12/10/2017, 11:51 AM

## 2017-12-10 NOTE — Progress Notes (Signed)
Patient ID: Cameron Eaton, male   DOB: November 28, 1965, 52 y.o.   MRN: [id number]   Subjective: 1 Day Post-Op Procedure(s) (LRB): LEFT TOTAL HIP ARTHROPLASTY ANTERIOR APPROACH (Left) Patient reports pain as mild and moderate.    Objective: Vital signs in last 24 hours: Temp:  [97.4 F (36.3 C)-98.3 F (36.8 C)] 98.3 F (36.8 C) (07/27 0920) Pulse Rate:  [53-78] 74 (07/27 0920) Resp:  [13-18] 16 (07/27 0920) BP: (104-135)/(70-96) 135/83 (07/27 0920) SpO2:  [96 %-100 %] 96 % (07/27 0920)  Intake/Output from previous day: 07/26 0701 - 07/27 0700 In: 4090 [P.O.:480; I.V.:3560; IV Piggyback:50] Out: 2800 [Urine:2600; Blood:200] Intake/Output this shift: Total I/O In: 360 [P.O.:360] Out: 0   Recent Labs    12/10/17 0539  HGB 12.8*   Recent Labs    12/10/17 0539  WBC 15.3*  RBC 4.21*  HCT 37.1*  PLT 198   Recent Labs    12/10/17 0539  NA 140  K 3.9  CL 110  CO2 25  BUN 21*  CREATININE 0.85  GLUCOSE 136*  CALCIUM 8.5*   No results for input(s): LABPT, INR in the last 72 hours.  Neurologically intact, LL equal. WBAT with walker Dg Pelvis Portable  Result Date: 12/09/2017 CLINICAL DATA:  Post left total hip replacement EXAM: PORTABLE PELVIS 1-2 VIEWS COMPARISON:  Left hip radiographs-11/14/2017 FINDINGS: Post left total hip replacement. No definite fracture or dislocation given solitary AP projection. Alignment appears anatomic. There is a minimal amount of expected subcutaneous emphysema about the operative site. Moderate degenerative change of the contralateral right hip is suspected though incompletely evaluated. Punctate phleboliths overlie the lower pelvis bilaterally. No radiopaque foreign body. IMPRESSION: Post left total hip replacement without evidence of complication. Electronically Signed   By: Simonne Come M.D.   On: 12/09/2017 12:57   Dg C-arm 1-60 Min-no Report  Result Date: 12/09/2017 Fluoroscopy was utilized by the requesting physician.  No radiographic  interpretation.   Dg Hip Operative Unilat W Or W/o Pelvis Left  Result Date: 12/09/2017 CLINICAL DATA:  Left hip replacement EXAM: OPERATIVE LEFT HIP WITH PELVIS COMPARISON:  None. FLUOROSCOPY TIME:  Radiation Exposure Index (as provided by the fluoroscopic device): 1.31 mGy If the device does not provide the exposure index: Fluoroscopy Time:  20 seconds Number of Acquired Images:  2 FINDINGS: Left hip prosthesis is noted in satisfactory position. No acute soft tissue or bony abnormality is noted. IMPRESSION: Status post left hip replacement Electronically Signed   By: Alcide Clever M.D.   On: 12/09/2017 12:17    Assessment/Plan: 1 Day Post-Op Procedure(s) (LRB): LEFT TOTAL HIP ARTHROPLASTY ANTERIOR APPROACH (Left) Up with therapy, standing with therapy at this time.   Cameron Eaton 12/10/2017, 10:27 AM

## 2017-12-10 NOTE — Care Management Note (Signed)
Case Management Note  Patient Details  Name: Cameron Eaton MRN: [id number] Date of Birth: 10-22-1965  Subjective/Objective:  S/p Left THA                  Action/Plan: NCM spoke to pt and offered choice for [facility]. Pt agreeable to [facility] for [facility]. (preoperatively arranged by surgeon's office). Contacted [facility] for RW and 3n1 bedside commode for home to be delivered to room prior to dc. Dtr and girlfriend will be at home to assist with his care.   Expected Discharge Date:                Expected Discharge Plan:  Home w Home Health Services  In-House Referral:  NA  Discharge planning Services  CM Consult  Post Acute Care Choice:  Home Health Choice offered to:  Patient  DME Arranged:  3-N-1, Walker rolling DME Agency:  [facility]  HH Arranged:  PT HH Agency:  [facility]  Status of Service:  Completed, signed off  If discussed at [employer], dates discussed:    Additional Comments:  Elliot Cousin, RN 12/10/2017, 3:08 PM

## 2017-12-10 NOTE — Plan of Care (Signed)
Plan of care discussed.   

## 2017-12-11 NOTE — Progress Notes (Signed)
Occupational Therapy Treatment Patient Details Name: Cameron Eaton MRN: [id number] DOB: [birth year] Today's Date: 12/11/2017    History of present illness s/p L hip   OT comments  Pt clammy and dizzy upon standing and getting to chair BP 112/68 Rn called  Follow Up Recommendations  No OT follow up    Equipment Recommendations  None recommended by OT    Recommendations for Other Services      Precautions / Restrictions Precautions Precautions: Fall Restrictions Weight Bearing Restrictions: No       Mobility Bed Mobility Overal bed mobility: Needs Assistance Bed Mobility: Sit to Supine       Sit to supine: Min assist   General bed mobility comments: assist to bring LLE onto bed   Transfers Overall transfer level: Needs assistance Equipment used: Rolling walker (2 wheeled) Transfers: Sit to/from [employer] Sit to Stand: Min assist Stand pivot transfers: Min assist       General transfer comment: cues for hand placement and LLE position        ADL either performed or assessed with clinical judgement   ADL Overall ADL's : Needs assistance/impaired                 Upper Body Dressing : Set up;Sitting   Lower Body Dressing: Moderate assistance;Sit to/from stand;Cueing for sequencing;Cueing for compensatory techniques;Cueing for safety   Toilet Transfer: Minimal assistance;Ambulation;Stand-pivot Toilet Transfer Details (indicate cue type and reason): bed to chair Toileting- Clothing Manipulation and Hygiene: Minimal assistance;Sit to/from stand;Cueing for sequencing;Cueing for safety         General ADL Comments: Pt became very dizzy and clammy. RN called BP 112/68     Vision Patient Visual Report: No change from baseline            Cognition Arousal/Alertness: Awake/alert Behavior During Therapy: WFL for tasks assessed/performed Overall Cognitive Status: Within Functional Limits for tasks assessed                                                      Pertinent Vitals/ Pain       Pain Score: 4  Pain Location: left hip Pain Descriptors / Indicators: Sore;Tightness Pain Intervention(s): Limited activity within patient's tolerance;Monitored during session;Repositioned;Patient requesting pain meds-RN notified         Frequency  Min 2X/week        Progress Toward Goals  OT Goals(current goals can now be found in the care plan section)  Progress towards OT goals: Progressing toward goals     Plan Discharge plan remains appropriate    Co-evaluation                 AM-PAC PT "6 Clicks" Daily Activity     Outcome Measure   Help from another person eating meals?: None Help from another person taking care of personal grooming?: None Help from another person toileting, which includes using toliet, bedpan, or urinal?: A Little Help from another person bathing (including washing, rinsing, drying)?: A Little Help from another person to put on and taking off regular upper body clothing?: A Little Help from another person to put on and taking off regular lower body clothing?: A Little 6 Click Score: 20    End of Session    OT Visit Diagnosis: Unsteadiness on feet (R26.81);Other abnormalities of gait and  mobility (R26.89)   Activity Tolerance Patient tolerated treatment well   Patient Left in chair;with nursing/sitter in room   Nurse Communication Mobility status        Time: 1610-9604 OT Time Calculation (min): 12 min  Charges: OT Evaluation $OT Eval Low Complexity: 1 Low OT Treatments $Self Care/Home Management : 8-22 mins  [place], [place] [phone]   Einar Crow D 12/11/2017, 9:30 AM

## 2017-12-11 NOTE — Progress Notes (Signed)
   Subjective: 2 Days Post-Op Procedure(s) (LRB): LEFT TOTAL HIP ARTHROPLASTY ANTERIOR APPROACH (Left) Patient reports pain as mild.  Vasovagal with OT , resolved after BM. One episode in past well before surgery.  Has been up since then doing fine. Requests to be discharged.   Objective: Vital signs in last 24 hours: Temp:  [98.4 F (36.9 C)-98.6 F (37 C)] 98.6 F (37 C) (07/28 0534) Pulse Rate:  [73-79] 74 (07/28 0910) Resp:  [16-17] 16 (07/28 0534) BP: (112-153)/(68-90) 119/71 (07/28 0910) SpO2:  [95 %-98 %] 95 % (07/28 0534)  Intake/Output from previous day: 07/27 0701 - 07/28 0700 In: 2132.5 [P.O.:1200; I.V.:932.5] Out: 540 [Urine:540] Intake/Output this shift: Total I/O In: -  Out: 375 [Urine:375]  Recent Labs    12/10/17 0539  HGB 12.8*   Recent Labs    12/10/17 0539  WBC 15.3*  RBC 4.21*  HCT 37.1*  PLT 198   Recent Labs    12/10/17 0539  NA 140  K 3.9  CL 110  CO2 25  BUN 21*  CREATININE 0.85  GLUCOSE 136*  CALCIUM 8.5*   No results for input(s): LABPT, INR in the last 72 hours.  Neurologically intact No results found.  Assessment/Plan: 2 Days Post-Op Procedure(s) (LRB): LEFT TOTAL HIP ARTHROPLASTY ANTERIOR APPROACH (Left) Up with therapy to do stairs then D/C home today  [name] 12/11/2017, 9:34 AM

## 2017-12-11 NOTE — Progress Notes (Signed)
Physical Therapy Treatment Patient Details Name: Cameron Eaton MRN: [id number] DOB: [birth year] Today's Date: 12/11/2017    History of Present Illness s/p L hip    PT Comments    Pt doing well; no dizziness with amb; BP 140/89--RN aware; all concerns addressed, pt with a little more pain and soreness today; feels ready for d/c    Follow Up Recommendations  Home health PT     Equipment Recommendations  Rolling walker with 5" wheels;3in1 (PT)    Recommendations for Other Services       Precautions / Restrictions Precautions Precautions: Fall Restrictions Weight Bearing Restrictions: No Other Position/Activity Restrictions: WBAT    Mobility  Bed Mobility Overal bed mobility: Needs Assistance Bed Mobility: Sit to Supine       Sit to supine: Min assist   General bed mobility comments: in recliner  Transfers Overall transfer level: Needs assistance Equipment used: Rolling walker (2 wheeled) Transfers: Sit to/from Stand Sit to Stand: Supervision Stand pivot transfers: Min assist       General transfer comment: cues for hand placement and LLE position  Ambulation/Gait Ambulation/Gait assistance: Modified independent (Device/Increase time);Supervision Gait Distance (Feet): 100 Feet Assistive device: Rolling walker (2 wheeled) Gait Pattern/deviations: Step-through pattern;Step-to pattern;Decreased weight shift to left     General Gait Details: cues for sequence, gait progression   Stairs Stairs: Yes Stairs assistance: Min guard;Min assist Stair Management: Step to pattern;Backwards;With walker Number of Stairs: 1(x2) General stair comments: cues for sequence adn technique   Wheelchair Mobility    Modified Rankin (Stroke Patients Only)       Balance                                            Cognition Arousal/Alertness: Awake/alert Behavior During Therapy: WFL for tasks assessed/performed Overall Cognitive Status: Within  Functional Limits for tasks assessed                                        Exercises      General Comments        Pertinent Vitals/Pain Pain Assessment: 0-10 Pain Score: 5  Pain Location: left hip Pain Descriptors / Indicators: Sore;Tightness Pain Intervention(s): Monitored during session;Limited activity within patient's tolerance    Home Living                      Prior Function            PT Goals (current goals can now be found in the care plan section) Acute Rehab PT Goals Patient Stated Goal: home tomorrow PT Goal Formulation: With patient Time For Goal Achievement: 12/17/17 Potential to Achieve Goals: Good Progress towards PT goals: Progressing toward goals    Frequency    7X/week      PT Plan Current plan remains appropriate    Co-evaluation              AM-PAC PT "6 Clicks" Daily Activity  Outcome Measure  Difficulty turning over in bed (including adjusting bedclothes, sheets and blankets)?: A Lot Difficulty moving from lying on back to sitting on the side of the bed? : A Little Difficulty sitting down on and standing up from a chair with arms (e.g., wheelchair, bedside commode, etc,.)?: A Little Help needed moving  to and from a bed to chair (including a wheelchair)?: A Little Help needed walking in hospital room?: A Little Help needed climbing 3-5 steps with a railing? : A Little 6 Click Score: 17    End of Session Equipment Utilized During Treatment: Gait belt Activity Tolerance: Patient tolerated treatment well Patient left: in chair;with call bell/phone within reach;with family/visitor present   PT Visit Diagnosis: Difficulty in walking, not elsewhere classified (R26.2)     Time: 4098-1191 PT Time Calculation (min) (ACUTE ONLY): 20 min  Charges:  $Gait Training: 8-22 mins                     Drucilla Chalet, PT Pager: [phone] 12/11/2017    Drucilla Chalet 12/11/2017, 11:08 AM

## 2017-12-11 NOTE — Progress Notes (Signed)
Pt given one pain med tab for trip home. Discharged from floor via w/c for transport home by car. Belongings & SO with pt. No changes in assessment. Chevis Weisensel, [employer]

## 2017-12-11 NOTE — Progress Notes (Signed)
Called to room by Lawson Fiscal, OT. OT was getting pt up when pt became diaphoretic, clammy & dizzy. Assisted to chair by OT. Pt beginning to feel better, no longer diaphoretic or dizzy.  See flow sheet for BP & pulse. Shironda Kain, [employer]

## 2017-12-12 ENCOUNTER — Encounter (HOSPITAL_COMMUNITY): Payer: Self-pay | Admitting: Orthopaedic Surgery

## 2017-12-12 NOTE — Discharge Summary (Signed)
Patient ID: Cameron Eaton MRN: [id number] DOB/AGE: 1965/10/27 52 y.o.  Admit date: 12/09/2017 Discharge date: 12/12/2017  Admission Diagnoses:  Principal Problem:   Unilateral primary osteoarthritis, left hip Active Problems:   Status post total replacement of left hip   Discharge Diagnoses:  Same  Past Medical History:  Diagnosis Date  . Arthritis   . CVA (cerebral infarction) 11/18/2012   Acute Rt brain by MRI  . Hypertension   . Posterior reversible encephalopathy syndrome (PRES)    neuro visit lov 02-28-13 , sx resolved     Surgeries: Procedure(s): LEFT TOTAL HIP ARTHROPLASTY ANTERIOR APPROACH on 12/09/2017   Consultants:   Discharged Condition: Improved  Hospital Course: Cameron Eaton is an 52 y.o. male who was admitted 12/09/2017 for operative treatment ofUnilateral primary osteoarthritis, left hip. Patient has severe unremitting pain that affects sleep, daily activities, and work/hobbies. After pre-op clearance the patient was taken to the operating room on 12/09/2017 and underwent  Procedure(s): LEFT TOTAL HIP ARTHROPLASTY ANTERIOR APPROACH.    Patient was given perioperative antibiotics:  Anti-infectives (From admission, onward)   Start     Dose/Rate Route Frequency Ordered Stop   12/09/17 1600  ceFAZolin (ANCEF) IVPB 1 g/50 mL premix     1 g 100 mL/hr over 30 Minutes Intravenous Every 6 hours 12/09/17 1455 12/09/17 2149   12/09/17 0845  ceFAZolin (ANCEF) IVPB 2g/100 mL premix     2 g 200 mL/hr over 30 Minutes Intravenous On call to O.R. 12/09/17 0831 12/09/17 1039   12/09/17 0836  ceFAZolin (ANCEF) 2-4 GM/100ML-% IVPB    Note to Pharmacy:  Ramond Craver   : cabinet override      12/09/17 0836 12/09/17 1039       Patient was given sequential compression devices, early ambulation, and chemoprophylaxis to prevent DVT.  Patient benefited maximally from hospital stay and there were no complications.    Recent vital signs:  Patient Vitals for the past  24 hrs:  BP Pulse  12/11/17 1020 137/87 77     Recent laboratory studies:  Recent Labs    12/10/17 0539  WBC 15.3*  HGB 12.8*  HCT 37.1*  PLT 198  NA 140  K 3.9  CL 110  CO2 25  BUN 21*  CREATININE 0.85  GLUCOSE 136*  CALCIUM 8.5*     Discharge Medications:   Allergies as of 12/11/2017      Reactions   Percocet [oxycodone-acetaminophen] Other (See Comments)   Delusional, irrational      Medication List    STOP taking these medications   aspirin 81 MG EC tablet Replaced by:  aspirin 81 MG chewable tablet     TAKE these medications   amLODipine 10 MG tablet Commonly known as:  NORVASC Take 1 tablet (10 mg total) by mouth daily. NEEDS APPOINTMENT FOR FUTURE REFILLS   aspirin 81 MG chewable tablet Chew 1 tablet (81 mg total) by mouth 2 (two) times daily. Replaces:  aspirin 81 MG EC tablet   hydrALAZINE 100 MG tablet Commonly known as:  APRESOLINE Take 1 tablet (100 mg total) by mouth every 8 (eight) hours. What changed:  when to take this   hydrochlorothiazide 25 MG tablet Commonly known as:  HYDRODIURIL Take 1 tablet (25 mg total) by mouth daily.   HYDROcodone-acetaminophen 7.5-325 MG tablet Commonly known as:  NORCO Take 1-2 tablets by mouth every 4 (four) hours as needed for severe pain (pain score 7-10).   lisinopril 20 MG tablet Commonly known as:  PRINIVIL,ZESTRIL Take 1 tablet (20 mg total) by mouth 2 (two) times daily.   methocarbamol 500 MG tablet Commonly known as:  ROBAXIN Take 1 tablet (500 mg total) by mouth every 6 (six) hours as needed for muscle spasms.   metoprolol succinate 50 MG 24 hr tablet Commonly known as:  TOPROL-XL Take 50 mg by mouth daily. Take with or immediately following a meal.   naphazoline-pheniramine 0.025-0.3 % ophthalmic solution Commonly known as:  NAPHCON-A Place 1 drop into both eyes daily as needed (redness).   naproxen sodium 220 MG tablet Commonly known as:  ALEVE Take 440 mg by mouth daily as needed  (hip pain).       Diagnostic Studies: Dg Pelvis Portable  Result Date: 12/09/2017 CLINICAL DATA:  Post left total hip replacement EXAM: PORTABLE PELVIS 1-2 VIEWS COMPARISON:  Left hip radiographs-11/14/2017 FINDINGS: Post left total hip replacement. No definite fracture or dislocation given solitary AP projection. Alignment appears anatomic. There is a minimal amount of expected subcutaneous emphysema about the operative site. Moderate degenerative change of the contralateral right hip is suspected though incompletely evaluated. Punctate phleboliths overlie the lower pelvis bilaterally. No radiopaque foreign body. IMPRESSION: Post left total hip replacement without evidence of complication. Electronically Signed   By: Simonne Come M.D.   On: 12/09/2017 12:57   Dg C-arm 1-60 Min-no Report  Result Date: 12/09/2017 Fluoroscopy was utilized by the requesting physician.  No radiographic interpretation.   Dg Hip Operative Unilat W Or W/o Pelvis Left  Result Date: 12/09/2017 CLINICAL DATA:  Left hip replacement EXAM: OPERATIVE LEFT HIP WITH PELVIS COMPARISON:  None. FLUOROSCOPY TIME:  Radiation Exposure Index (as provided by the fluoroscopic device): 1.31 mGy If the device does not provide the exposure index: Fluoroscopy Time:  20 seconds Number of Acquired Images:  2 FINDINGS: Left hip prosthesis is noted in satisfactory position. No acute soft tissue or bony abnormality is noted. IMPRESSION: Status post left hip replacement Electronically Signed   By: Alcide Clever M.D.   On: 12/09/2017 12:17   Xr Hip Unilat W Or W/o Pelvis 1v Left  Result Date: 11/14/2017 An AP pelvis and lateral left hip show severe osteoarthritis and degenerative joint disease of left hip.  There is complete loss of joint space.  There is particular osteophytes and sclerotic changes.  Cystic changes in the femoral head and the acetabulum.   Disposition:     Follow-up Information    Kathryne Hitch, MD Follow up in 2  week(s).   Specialty:  Orthopedic Surgery Contact information: [address] [place] [address] [phone]        [facility] Follow up.   Specialty:  Home Health Services Why:  Home Health Physical Therapy- agency will call to arrange appt Contact information: [address] [place] [address] [place] [address] [phone]        [facility] - Dme Follow up.   Why:  RW and 3n1 bedside commode - delivered to room prior to discharge Contact information: [address] [place] [address] [phone]            Signed: Kathryne Hitch 12/12/2017, 9:20 AM

## 2017-12-13 ENCOUNTER — Telehealth (INDEPENDENT_AMBULATORY_CARE_PROVIDER_SITE_OTHER): Payer: Self-pay | Admitting: Physician Assistant

## 2017-12-13 NOTE — Telephone Encounter (Signed)
Cameron Eaton from [facility] called to request VO for PT for the following:  3x a week for 1 week 2x a week for 1 week  CB#[phone].  Thank you.

## 2017-12-13 NOTE — Telephone Encounter (Signed)
Verbal order given  

## 2017-12-22 ENCOUNTER — Encounter (INDEPENDENT_AMBULATORY_CARE_PROVIDER_SITE_OTHER): Payer: Self-pay | Admitting: Physician Assistant

## 2017-12-22 ENCOUNTER — Ambulatory Visit (INDEPENDENT_AMBULATORY_CARE_PROVIDER_SITE_OTHER): Payer: BLUE CROSS/BLUE SHIELD | Admitting: Physician Assistant

## 2017-12-22 DIAGNOSIS — Z96642 Presence of left artificial hip joint: Secondary | ICD-10-CM

## 2017-12-22 NOTE — Progress Notes (Signed)
HPI: [name] returns now 13 days status post left total hip arthroplasty.  He is overall doing well.  He states his range of motion strength improving.  He is on aspirin for DVT prophylaxis.  He was taking a baby aspirin prior to surgery.  Ambulate with a cane.  Denies any fever chills shortness of breath chest pain.  Physical exam: General well-developed well-nourished male in no acute distress mood affect appropriate. Left hip: Surgical incisions healing well.  Well approximated with subcu stitch.  Left calf supple nontender.  Dorsiflexion plantarflexion ankle intact.  Impression: Status post left total hip arthroplasty 12/09/2017  Plan: He will work on range of motion strengthening the hip.  He will go back to his 81 mg aspirin that he was on prior to surgery.  Follow-up with [place] in 1 month sooner if there is any questions concerns or wound healing issues.  Questions were encouraged and answered.

## 2018-01-19 ENCOUNTER — Ambulatory Visit (INDEPENDENT_AMBULATORY_CARE_PROVIDER_SITE_OTHER): Payer: BLUE CROSS/BLUE SHIELD | Admitting: Orthopaedic Surgery

## 2018-01-19 ENCOUNTER — Encounter (INDEPENDENT_AMBULATORY_CARE_PROVIDER_SITE_OTHER): Payer: Self-pay | Admitting: Orthopaedic Surgery

## 2018-01-19 DIAGNOSIS — Z96642 Presence of left artificial hip joint: Secondary | ICD-10-CM

## 2018-01-19 NOTE — Progress Notes (Signed)
The patient is now 6 weeks status post left total hip arthroplasty through direct intra-approach.  He is doing well overall and is making good progress.  He started swimming today.  He does wish to get back to golf.  He is already back to his regular job as well.  On exam he talks to me easily putting his right hip and left hip through internal and external rotation.  His right hip shows no arthritic changes on x-ray.  His left operative hip is doing well.  His incision looks good overall.  He understands expect swelling for a while that will decrease.  He will increase his activities as comfort allows.  We will see him back in 6 months to see how is doing overall and I would like a low AP pelvis and lateral of his left operative hip at that visit.

## 2018-02-24 ENCOUNTER — Encounter (HOSPITAL_COMMUNITY): Payer: Self-pay

## 2018-02-24 ENCOUNTER — Emergency Department (HOSPITAL_COMMUNITY)
Admission: EM | Admit: 2018-02-24 | Discharge: 2018-02-24 | Disposition: A | Discharge status: Home / Self Care | Payer: No Typology Code available for payment source | Attending: Emergency Medicine | Admitting: Emergency Medicine

## 2018-02-24 ENCOUNTER — Other Ambulatory Visit: Payer: Self-pay

## 2018-02-24 ENCOUNTER — Emergency Department (HOSPITAL_COMMUNITY): Payer: No Typology Code available for payment source

## 2018-02-24 DIAGNOSIS — Y9389 Activity, other specified: Secondary | ICD-10-CM | POA: Diagnosis not present

## 2018-02-24 DIAGNOSIS — Z79899 Other long term (current) drug therapy: Secondary | ICD-10-CM | POA: Insufficient documentation

## 2018-02-24 DIAGNOSIS — Z9114 Patient's other noncompliance with medication regimen: Secondary | ICD-10-CM | POA: Diagnosis not present

## 2018-02-24 DIAGNOSIS — Z7982 Long term (current) use of aspirin: Secondary | ICD-10-CM | POA: Diagnosis not present

## 2018-02-24 DIAGNOSIS — R51 Headache: Secondary | ICD-10-CM | POA: Diagnosis not present

## 2018-02-24 DIAGNOSIS — Z96642 Presence of left artificial hip joint: Secondary | ICD-10-CM | POA: Insufficient documentation

## 2018-02-24 DIAGNOSIS — Y999 Unspecified external cause status: Secondary | ICD-10-CM | POA: Diagnosis not present

## 2018-02-24 DIAGNOSIS — I10 Essential (primary) hypertension: Secondary | ICD-10-CM | POA: Diagnosis not present

## 2018-02-24 DIAGNOSIS — Y92410 Unspecified street and highway as the place of occurrence of the external cause: Secondary | ICD-10-CM | POA: Diagnosis not present

## 2018-02-24 DIAGNOSIS — M25512 Pain in left shoulder: Secondary | ICD-10-CM | POA: Diagnosis present

## 2018-02-24 MED ORDER — METHOCARBAMOL 500 MG PO TABS: 500.0000 mg | ORAL_TABLET | Freq: Two times a day (BID) | ORAL | 0 refills | Status: AC

## 2018-02-24 MED ORDER — NAPROXEN 500 MG PO TABS: 500.0000 mg | ORAL_TABLET | Freq: Two times a day (BID) | ORAL | 0 refills | Status: AC
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
## 2018-07-20 ENCOUNTER — Ambulatory Visit (INDEPENDENT_AMBULATORY_CARE_PROVIDER_SITE_OTHER): Payer: BLUE CROSS/BLUE SHIELD | Admitting: Orthopaedic Surgery
